# Patient Record
Sex: Female | Born: 1955
Health system: Southern US, Community
[De-identification: ages and names within clinical notes are randomized; demographics above are authoritative.]

## PROBLEM LIST (undated history)

## (undated) DIAGNOSIS — I8390 Asymptomatic varicose veins of unspecified lower extremity: Secondary | ICD-10-CM

## (undated) DIAGNOSIS — R9389 Abnormal findings on diagnostic imaging of other specified body structures: Secondary | ICD-10-CM

## (undated) DIAGNOSIS — M2550 Pain in unspecified joint: Secondary | ICD-10-CM

## (undated) DIAGNOSIS — T7840XA Allergy, unspecified, initial encounter: Secondary | ICD-10-CM

## (undated) HISTORY — PX: VARICOSE VEIN SURGERY: SHX832

## (undated) HISTORY — PX: TONSILLECTOMY: SUR1361

## (undated) HISTORY — PX: TONSILECTOMY, ADENOIDECTOMY, BILATERAL MYRINGOTOMY AND TUBES: SHX2538

## (undated) HISTORY — DX: Pain in unspecified joint: M25.50

## (undated) HISTORY — PX: DILATATION & CURRETTAGE/HYSTEROSCOPY WITH RESECTOCOPE: SHX5572

## (undated) HISTORY — DX: Allergy, unspecified, initial encounter: T78.40XA

---

## 1987-01-29 HISTORY — PX: OTHER SURGICAL HISTORY: SHX169

## 1992-01-29 HISTORY — PX: TUBAL LIGATION: SHX77

## 1997-06-23 ENCOUNTER — Other Ambulatory Visit: Admission: RE | Admit: 1997-06-23 | Discharge: 1997-06-23 | Payer: Self-pay | Admitting: Gynecology

## 1997-06-27 ENCOUNTER — Ambulatory Visit (HOSPITAL_COMMUNITY): Admission: RE | Admit: 1997-06-27 | Discharge: 1997-06-27 | Payer: Self-pay | Admitting: Gynecology

## 1998-10-24 ENCOUNTER — Ambulatory Visit (HOSPITAL_COMMUNITY): Admission: RE | Admit: 1998-10-24 | Discharge: 1998-10-24 | Payer: Self-pay | Admitting: Gynecology

## 1998-10-24 ENCOUNTER — Encounter: Payer: Self-pay | Admitting: Gynecology

## 1999-05-17 ENCOUNTER — Other Ambulatory Visit: Admission: RE | Admit: 1999-05-17 | Discharge: 1999-05-17 | Payer: Self-pay | Admitting: Gynecology

## 2000-11-24 ENCOUNTER — Other Ambulatory Visit: Admission: RE | Admit: 2000-11-24 | Discharge: 2000-11-24 | Payer: Self-pay | Admitting: Gynecology

## 2000-12-02 ENCOUNTER — Encounter: Payer: Self-pay | Admitting: Gynecology

## 2000-12-02 ENCOUNTER — Ambulatory Visit (HOSPITAL_COMMUNITY): Admission: RE | Admit: 2000-12-02 | Discharge: 2000-12-02 | Payer: Self-pay | Admitting: Gynecology

## 2001-01-26 ENCOUNTER — Other Ambulatory Visit: Admission: RE | Admit: 2001-01-26 | Discharge: 2001-01-26 | Payer: Self-pay | Admitting: Gynecology

## 2001-09-14 ENCOUNTER — Other Ambulatory Visit: Admission: RE | Admit: 2001-09-14 | Discharge: 2001-09-14 | Payer: Self-pay | Admitting: Gynecology

## 2002-09-23 ENCOUNTER — Other Ambulatory Visit: Admission: RE | Admit: 2002-09-23 | Discharge: 2002-09-23 | Payer: Self-pay | Admitting: Gynecology

## 2005-03-27 ENCOUNTER — Encounter: Payer: Self-pay | Admitting: Gynecology

## 2005-04-10 ENCOUNTER — Other Ambulatory Visit: Admission: RE | Admit: 2005-04-10 | Discharge: 2005-04-10 | Payer: Self-pay | Admitting: Gynecology

## 2006-04-11 ENCOUNTER — Ambulatory Visit (HOSPITAL_COMMUNITY): Admission: RE | Admit: 2006-04-11 | Discharge: 2006-04-11 | Payer: Self-pay | Admitting: Gynecology

## 2008-10-31 ENCOUNTER — Ambulatory Visit (HOSPITAL_COMMUNITY): Admission: RE | Admit: 2008-10-31 | Discharge: 2008-10-31 | Payer: Self-pay | Admitting: Gynecology

## 2008-11-24 ENCOUNTER — Ambulatory Visit (HOSPITAL_BASED_OUTPATIENT_CLINIC_OR_DEPARTMENT_OTHER): Admission: RE | Admit: 2008-11-24 | Discharge: 2008-11-24 | Payer: Self-pay | Admitting: Gynecology

## 2008-11-24 ENCOUNTER — Encounter (INDEPENDENT_AMBULATORY_CARE_PROVIDER_SITE_OTHER): Payer: Self-pay | Admitting: Gynecology

## 2010-05-03 ENCOUNTER — Encounter (INDEPENDENT_AMBULATORY_CARE_PROVIDER_SITE_OTHER): Payer: Commercial Managed Care - PPO

## 2010-05-03 DIAGNOSIS — I83893 Varicose veins of bilateral lower extremities with other complications: Secondary | ICD-10-CM

## 2010-05-03 LAB — POCT HEMOGLOBIN-HEMACUE: Hemoglobin: 15.2 g/dL — ABNORMAL HIGH (ref 12.0–15.0)

## 2010-05-07 ENCOUNTER — Encounter (INDEPENDENT_AMBULATORY_CARE_PROVIDER_SITE_OTHER): Payer: Commercial Managed Care - PPO | Admitting: Vascular Surgery

## 2010-05-07 DIAGNOSIS — I83893 Varicose veins of bilateral lower extremities with other complications: Secondary | ICD-10-CM

## 2010-05-08 NOTE — Procedures (Unsigned)
LOWER EXTREMITY VENOUS REFLUX EXAM  INDICATION:  Varicose veins.  EXAM:  Using color-flow imaging and pulse Doppler spectral analysis, the right and left common femoral, superficial femoral, popliteal, posterior tibial, greater and lesser saphenous veins are evaluated.  There is evidence suggesting deep venous insufficiency in the right and left common femoral veins.  The right and left saphenofemoral junctions are not competent with Reflux of >58milliseconds. The right and left GSV is not competent with Reflux of >542milliseconds with the caliber as described below.  The right and left proximal short saphenous vein demonstrates competency.  GSV Diameter (used if found to be incompetent only)                                                Right     Left Proximal Greater Saphenous Vein                0.53 cm   0.63 cm Proximal-to-mid-thigh                          0.41 cm   0.63 cm Mid thigh                                      0.44 cm   0.62 cm Mid-distal thigh                               cm        cm Distal thigh                                   0.41 cm   0.42 cm Knee                                           cm        cm  IMPRESSION: 1. Right and left greater saphenous veins are not competent with     reflux >500 milliseconds. 2. The right and left greater saphenous veins are not tortuous. 3. The deep venous system is competent. 4. The right and left small saphenous veins are competent.  ___________________________________________ Quita Skye. Hart [place], M.D.  OD/MEDQ  D:  05/03/2010  T:  05/03/2010  Job:  [id number]

## 2010-05-08 NOTE — Consult Note (Signed)
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  She describes an aching, throbbing discomfort in both legs as the day progresses and she has tried treating this with elastic compression stockings without success.  She has noted darkening of the skin proximal to her right ankle over the last few years but no spontaneous stasis ulcers have occurred.  The area was traumatized by her dog in January of this year and it took several weeks for the skin to heal in this area with the darkness is located.  She has no history of DVT, thrombophlebitis, bleeding varicosities but does have edema in the ankles as the day progresses.  CHRONIC MEDICAL PROBLEMS:  Denies diabetes, hypertension, hyperlipidemia, coronary artery disease, COPD or stroke.  Has a history of uterine fibroid which was treated with ablation therapy.  SOCIAL HISTORY:  She is married, has 4 children, works as an Charity fundraiser.  Does not use tobacco or alcohol.  FAMILY HISTORY:  Positive for cerebral hemorrhage in her mother. Negative for coronary artery disease.  Positive for diabetes in an aunt.  REVIEW OF SYSTEMS:  Positive for headaches.  Denies chest pain, dyspnea on exertion, PND, orthopnea.  No claudication.  Denies any musculoskeletal or GI problems.  All other systems in complete review of systems are negative.  PHYSICAL EXAMINATION:  Vital signs:  Blood pressure 112/70, heart rate 70, respirations 18.  General:  She is a well-developed, well-nourished female in no apparent distress, alert and oriented x3.  HEENT:  Exam is normal for age.  EOMs intact.  Lungs:  Clear to auscultation.  No rhonchi or wheezing.  Cardiovascular:  Regular rhythm.  No  murmurs. Carotid pulses 3+.  No audible bruits.  Abdomen:  Soft, nontender with no masses.  Musculoskeletal:  Free of major deformities.  Neurological: Normal.  Skin:  Exam reveals dermatoliposclerosis in the right ankle lower third of the leg proximal to the medial malleolus with evidence of a healed ulcer in this area.  There are some bulging varicosities in the left leg in the distal thigh and medial calf area.  There is 1+ edema bilaterally.  She has 3+ femoral, popliteal and dorsalis pedis pulses palpable bilaterally.  She had a venous duplex exam performed in our office on April 5 which I reviewed and interpreted.  She has gross reflux in both great saphenous systems from the distal thigh to the saphenofemoral junction but no evidence of DVT.  This patient does have severe venous insufficiency which has caused significant skin changes in the right leg and has symptomatic bulging varicosities in the left leg.  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  We will treat her with long leg elastic compression stockings (20 mm - 30 mm gradient) as well as elevation and ibuprofen.  She will return in 3 months.  If there has been no improvement she should have a) laser ablation of the right great saphenous vein followed by b) laser ablation  of the left great saphenous vein.  She will return in 3 months for further followup.    Quita Skye Hart Rochester, M.D. Electronically Signed  JDL/MEDQ  D:  05/07/2010  T:  05/08/2010  Job:  [id number]

## 2010-08-13 ENCOUNTER — Ambulatory Visit (INDEPENDENT_AMBULATORY_CARE_PROVIDER_SITE_OTHER): Payer: Commercial Managed Care - PPO | Admitting: Vascular Surgery

## 2010-08-13 DIAGNOSIS — I83893 Varicose veins of bilateral lower extremities with other complications: Secondary | ICD-10-CM

## 2010-08-14 NOTE — Assessment & Plan Note (Signed)
OFFICE VISIT  CASIDEE, JANN DOB:  06/25/1955                                       08/13/2010 ZOXWR#:[id number]  Ms. Heminger returns for a follow-up visit regarding her severe venous insufficiency of both lower extremities.  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  She has also tried ibuprofen.  She continues to have aching, throbbing and burning discomfort which worsens as the day progresses.  She has had darkening of the skin proximal to her right ankle which has progressed over the last few years with some thickening but no stasis ulcers.  The bulging varicosities in her left thigh and calf have become increasingly symptomatic and are affecting her daily living and ability to work.  On examination, she continues to have bulging varicosities in the left thigh over the great saphenous system.  She has hyperpigmentation and lipodermatosclerosis in the right lower leg in the lower third with some very prominent reticular veins beneath the thickened skin and distally. She has 3+ dorsalis pedis pulses bilaterally.  She does have documented gross reflux in both great saphenous systems from the knee to the saphenofemoral junction.  I think the best plan would be to proceed with: 1. Laser ablation of the right great saphenous vein to be followed by     one course of sclerotherapy 2. Laser ablation of the left great saphenous vein with 10-20 stab     phlebectomies at the same setting.  We will proceed with precertification for Ms. Eddie in the near future.    Quita Skye Hart [place], M.D. Electronically Signed  JDL/MEDQ  D:  08/13/2010  T:  08/14/2010  Job:  [id number]

## 2010-09-06 ENCOUNTER — Encounter: Payer: Self-pay | Admitting: Vascular Surgery

## 2010-09-17 ENCOUNTER — Other Ambulatory Visit: Payer: Commercial Managed Care - PPO | Admitting: Vascular Surgery

## 2010-09-17 ENCOUNTER — Ambulatory Visit (INDEPENDENT_AMBULATORY_CARE_PROVIDER_SITE_OTHER): Payer: Commercial Managed Care - PPO | Admitting: Vascular Surgery

## 2010-09-17 VITALS — BP 125/73 | HR 70 | Resp 20 | Ht 65.0 in | Wt 182.0 lb

## 2010-09-17 DIAGNOSIS — I83893 Varicose veins of bilateral lower extremities with other complications: Secondary | ICD-10-CM

## 2010-09-17 NOTE — Progress Notes (Signed)
Laser ablation R GSV 09/17/10

## 2010-09-17 NOTE — Progress Notes (Signed)
Subjective:     Patient ID: Felicia Sullivan, female   DOB: January 21, 1956, 55 y.o.   MRN: [id number]  HPI this patient underwent laser ablation of the right great saphenous vein under local tumescent anesthesia tolerated the procedure well. She will return in one week for a venous duplex exam to confirm closure of the right great saphenous vein.   Review of Systems     Objective:   Physical Exam Blood pressures 125 or 73 heart rate 70 respirations 20    Assessment:     Laser ablation of right great saphenous vein-tolerated well    Plan:     Return on August 27 for venous duplex exam of right leg to confirm closure and then schedule similar procedure on the contralateral left leg.

## 2010-09-18 ENCOUNTER — Telehealth: Payer: Self-pay | Admitting: *Deleted

## 2010-09-18 NOTE — Telephone Encounter (Signed)
Left a message checking on pt's status after her laser ablation. Asked her to call me if she was having any problems or had any questions.

## 2010-09-24 ENCOUNTER — Ambulatory Visit (INDEPENDENT_AMBULATORY_CARE_PROVIDER_SITE_OTHER): Payer: Commercial Managed Care - PPO | Admitting: Vascular Surgery

## 2010-09-24 ENCOUNTER — Ambulatory Visit (INDEPENDENT_AMBULATORY_CARE_PROVIDER_SITE_OTHER): Payer: Commercial Managed Care - PPO

## 2010-09-24 VITALS — BP 119/70 | HR 67 | Resp 20 | Ht 65.0 in | Wt 182.0 lb

## 2010-09-24 DIAGNOSIS — Z48812 Encounter for surgical aftercare following surgery on the circulatory system: Secondary | ICD-10-CM

## 2010-09-24 DIAGNOSIS — I83893 Varicose veins of bilateral lower extremities with other complications: Secondary | ICD-10-CM

## 2010-09-24 NOTE — Progress Notes (Signed)
One week fu laser ablation R GSV.

## 2010-09-24 NOTE — Progress Notes (Signed)
Subjective:     Patient ID: Felicia Sullivan, female   DOB: 08/24/1955, 55 y.o.   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 She has had minimal discomfort in the right thigh since the procedure. She has been wearing her right long leg elastic compression stocking and taking ibuprofen as instructed.  Past Medical History  Diagnosis Date  . Headache   . Phlebitis     History  Substance Use Topics  . Smoking status: Never Smoker   . Smokeless tobacco: Not on file  . Alcohol Use: No    Family History  Problem Relation Age of Onset  . Stroke Mother     Allergies  Allergen Reactions  . Aspirin   . Penicillins   . Tetracyclines & Related     No current outpatient prescriptions on file.  BP 119/70  Pulse 67  Resp 20  Ht 5\' 5"  (1.651 m)  Wt 182 lb (82.555 kg)  BMI 30.29 kg/m2  Body mass index is 30.29 kg/(m^2).       Review of Systems she denies any chest pain dyspnea on exertion PND or orthopnea. He has had no hemoptysis.    Objective:   Physical Exam blood pressure 119/70 heart rate 67 respirations 20 Right leg exam reveals 3+ femoral and dorsalis pedis pulse palpable There is minimal discomfort along the course of the right great saphenous vein from the saphenofemoral junction to the knee The area of hyperpigmentation in the medial aspect of the right ankle is unchanged with no active ulceration    Assessment:    today I ordered a venous duplex exam of the right leg which are reviewed and interpreted. There is complete closure of the right great saphenous vein from 1 cm from the junction to the distal thigh. There is no DVT.    Plan:     Return in one week for laser ablation left great saphenous vein with multiple stab phlebectomy

## 2010-10-05 ENCOUNTER — Encounter: Payer: Self-pay | Admitting: Vascular Surgery

## 2010-10-08 ENCOUNTER — Ambulatory Visit (INDEPENDENT_AMBULATORY_CARE_PROVIDER_SITE_OTHER): Payer: Commercial Managed Care - PPO | Admitting: Vascular Surgery

## 2010-10-08 VITALS — BP 115/88 | HR 64 | Resp 20 | Ht 65.0 in | Wt 181.0 lb

## 2010-10-08 DIAGNOSIS — I83893 Varicose veins of bilateral lower extremities with other complications: Secondary | ICD-10-CM

## 2010-10-08 NOTE — Progress Notes (Signed)
Subjective:     Patient ID: Felicia Sullivan, female   DOB: [birth year], 55 y.o.   MRN: [id number]  HPI this 55 year old female had laser laser of her left great saphenous vein from the mid thigh to the saphenofemoral junction done under local tumescent anesthesia. She also had 10-20 stab phlebectomy was performed. She tolerated both procedures well. Long-leg elastic compression stocking was applied.   Review of Systems     Objective:   Physical Exam blood pressure 150/88 heart rate 64 respirations 20    Assessment:    well-tolerated laser ablation left great saphenous vein +10-20 stab phlebectomy    Plan:     Return in one week for venous duplex exam to confirm closure left great saphenous vein

## 2010-10-08 NOTE — Progress Notes (Signed)
Addended by: Clementeen Hoof on: 10/08/2010 12:32 PM   Modules accepted: Orders

## 2010-10-08 NOTE — Progress Notes (Signed)
Subjective:     Patient ID: Felicia Sullivan, female   DOB: 1955-10-07, 55 y.o.   MRN: [id number]  HPI  Laser Ablation Procedure      Date: 10/08/2010    Felicia Sullivan DOB:Jun 23, 1955  Consent signed: yes  Surgeon:James D. Hart Rochester  Procedure: Laser Ablation: Left great  Saphenous Vein  BP 115/88  Pulse 64  Resp 20  Ht 5\' 5"  (1.651 m)  Wt 181 lb (82.101 kg)  BMI 30.12 kg/m2  Start time: 11:00   End time: 12:10  Tumescent Anesthesia: 300 cc 0.9% NaCl with 50 cc Lidocaine HCL with 1% Epi and 15 cc 8.4% NaHCO3  Local Anesthesia: 11 cc Lidocaine HCL and NaHCO3 (ratio 2:1)  Pulsed mode: 15 watts, 1 sec, 1 pulse   Stab Phlebectomy: 10-20 Sites:   Patient tolerated procedure well: yes   Description of Procedure:  After marking the course of the saphenous vein and the secondary varicosities in the standing position, the patient was placed on the operating table in the supine position, and left leg was prepped and draped in sterile fashion. Local anesthetic was administered, and under ultrasound guidance the saphenous vein was accessed with a micro needle and guide wire; then the micro puncture sheath was placed. A guide wire was inserted to the saphenofemoral junction, followed by a 5 french sheath.  The position of the sheath and then the laser fiber below the junction was confirmed using the ultrasound and visualization of the aiming beam.  Tumescent anesthesia was administered along the course of the saphenous vein using ultrasound guidance. Protective laser glasses were placed on the patient, and the laser was fired at 15 watts.  For a total of 779 joules.  A steri strip was applied to the puncture site.  The patient was then put into Trendelenburg position.  Local anesthetic was utilized overlying the marked varicosities. Ten-20 stab wounds were made using the tip of an 11 blade; and using the vein hook.  The phlebectomies were performed using a hemostat to avulse these varicosities.   Adequate hemostasis was achieved, and steri strips were applied to the stab wound.     ABD pads and thigh high compression stockings were applied.  Ace wrap bandages were applied over the phlebectomy sites and to the top of the saphenofemoral junction.  Blood loss was less than 15 cc.  The patient ambulated out of the operating room having tolerated the procedure well.    Review of Systems     Physical Exam

## 2010-10-09 ENCOUNTER — Telehealth: Payer: Self-pay | Admitting: *Deleted

## 2010-10-09 NOTE — Procedures (Unsigned)
DUPLEX DEEP VENOUS EXAM - LOWER EXTREMITY  INDICATION:  One week followup right GSV ablation.  HISTORY:  Edema:  No. Trauma/Surgery:  EVLT. Pain:  No. PE:  No. Previous DVT:  No. Anticoagulants:  No. Other:  DUPLEX EXAM:               CFV   SFV   PopV  PTV    GSV               R  L  R  L  R  L  R   L  R  L Thrombosis    o     o     o     o      + Spontaneous      +  +     +            o Phasic           +  +     +            o Augmentation     +  +     +     +      o Compressible     +  +     +     +      o Competent        +  +     +     +      o  Legend:  + - yes  o - no  p - partial  D - decreased  IMPRESSION: 1. Right greater saphenous vein appears ablated from 0.92 distal to     the junction through the proximal calf at the insertion point. 2. No deep venous involvement was observed.   _____________________________ Quita Skye. Hart Rochester, M.D.  LT/MEDQ  D:  09/24/2010  T:  09/24/2010  Job:  [id number]

## 2010-10-09 NOTE — Telephone Encounter (Signed)
Called patient the morning after her laser ablation of her L GSV. She is doing well, having no problems, following all instructions. We scheduled her approved sclero treatment and she will return next week for her lab study and JDL visit.

## 2010-10-12 ENCOUNTER — Encounter: Payer: Self-pay | Admitting: Vascular Surgery

## 2010-10-15 ENCOUNTER — Ambulatory Visit (INDEPENDENT_AMBULATORY_CARE_PROVIDER_SITE_OTHER): Payer: Commercial Managed Care - PPO | Admitting: Vascular Surgery

## 2010-10-15 ENCOUNTER — Other Ambulatory Visit (INDEPENDENT_AMBULATORY_CARE_PROVIDER_SITE_OTHER): Payer: Commercial Managed Care - PPO

## 2010-10-15 VITALS — BP 133/63 | HR 80 | Resp 18 | Ht 65.0 in | Wt 181.0 lb

## 2010-10-15 DIAGNOSIS — Z48812 Encounter for surgical aftercare following surgery on the circulatory system: Secondary | ICD-10-CM

## 2010-10-15 DIAGNOSIS — I83893 Varicose veins of bilateral lower extremities with other complications: Secondary | ICD-10-CM

## 2010-10-15 DIAGNOSIS — I8310 Varicose veins of unspecified lower extremity with inflammation: Secondary | ICD-10-CM

## 2010-10-15 NOTE — Progress Notes (Signed)
Subjective:     Patient ID: Edmonia Caprio, female   DOB: [birth year], 55 y.o.   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 She tolerated the procedure well last week and has had minimal discomfort. She has had some mild aching in the thigh and proximal calf area. She has had no distal edema. She did wear the elastic compression stocking as recommended as well as take ibuprofen and elevate the leg.  Review of Systems     Objective:   Physical Exam blood pressure 133/63 heart rate 80 respirations 18 She is alert and oriented x3 and in no apparent distress Lower extremity exam reveals the left leg to have some mild to moderate ecchymoses around the stab phlebectomy sites in the thigh and calf region. There is no evidence of infection. There is no distal edema. She has 3+ dorsalis pedis pulse palpable the left foot. There is mild discomfort along the course of the ablated saphenous vein in the thigh.  Today I ordered a venous duplex exam which I reviewed and interpreted. There is no evidence of DVT. The left great saphenous vein is totally closed from the distal thigh to the saphenofemoral junction.    Assessment:     Successful ablation left great saphenous vein with multiple stab phlebectomy    Plan:     Will be scheduled for sclerotherapy of residual varicosities to conclude her scheduled treatment sessions

## 2010-10-16 NOTE — Progress Notes (Signed)
Addended by: Sharee Pimple on: 10/16/2010 11:12 AM   Modules accepted: Orders

## 2010-10-24 NOTE — Procedures (Unsigned)
DUPLEX DEEP VENOUS EXAM - LOWER EXTREMITY  INDICATION:  Followup great saphenous vein ablation.  HISTORY:  Edema:  No. Trauma/Surgery:  Left GSV ablation 10/08/2010. Pain:  Tenderness. PE:  No. Previous DVT:  No. Anticoagulants:  No. Other:  DUPLEX EXAM:               CFV   SFV   PopV  PTV    GSV               R  L  R  L  R  L  R   L  R  L Thrombosis    o  o     o     o      o     + Spontaneous   +  +     +     +      +     o Phasic        +  +     +     +      +     o Augmentation  +  +     +     +      +     o Compressible  +  +     +     +      +     o Competent     +  o     +     +      +     +  Legend:  + - yes  o - no  p - partial  D - decreased  IMPRESSION: 1. No evidence of left lower extremity deep venous thrombosis was     noted. 2. The left great saphenous vein is thrombosed from the distal     insertion site to the saphenofemoral junction.   _____________________________ Felicia Sullivan, M.D.  EM/MEDQ  D:  10/16/2010  T:  10/16/2010  Job:  [id number]

## 2010-11-07 ENCOUNTER — Ambulatory Visit (INDEPENDENT_AMBULATORY_CARE_PROVIDER_SITE_OTHER): Payer: Commercial Managed Care - PPO | Admitting: *Deleted

## 2010-11-07 ENCOUNTER — Ambulatory Visit: Payer: Commercial Managed Care - PPO

## 2010-11-07 DIAGNOSIS — I83893 Varicose veins of bilateral lower extremities with other complications: Secondary | ICD-10-CM

## 2010-11-07 NOTE — Progress Notes (Signed)
Subjective:     Patient ID: Felicia Sullivan, female   DOB: Dec 04, 1955, 55 y.o.   MRN: [id number]  HPI  X=.3% Sotradecol administered with a 27g butterfly.  Patient received a total of 11cc foam.        Photos: yes archived  Compression stockings applied: yes and ace wraps   Review of Systems     Objective:   Physical Exam     Assessment:     Treated all areas of concern. Small spiders that surrounded area of skin discoloration at inner ankles and large reticulars on upper Right thigh. Anticipate good results.    Plan:    Follow prn.

## 2011-01-04 ENCOUNTER — Encounter: Payer: Self-pay | Admitting: Vascular Surgery

## 2011-01-07 ENCOUNTER — Encounter: Payer: Self-pay | Admitting: Vascular Surgery

## 2012-03-14 ENCOUNTER — Other Ambulatory Visit: Payer: Self-pay

## 2012-03-25 ENCOUNTER — Other Ambulatory Visit: Payer: Self-pay | Admitting: Gynecology

## 2012-03-25 ENCOUNTER — Other Ambulatory Visit: Payer: Self-pay | Admitting: Obstetrics and Gynecology

## 2012-03-25 DIAGNOSIS — M8580 Other specified disorders of bone density and structure, unspecified site: Secondary | ICD-10-CM

## 2012-03-25 DIAGNOSIS — Z1231 Encounter for screening mammogram for malignant neoplasm of breast: Secondary | ICD-10-CM

## 2012-04-22 ENCOUNTER — Ambulatory Visit
Admission: RE | Admit: 2012-04-22 | Discharge: 2012-04-22 | Disposition: A | Discharge status: Home / Self Care | Payer: 59 | Source: Ambulatory Visit | Attending: Obstetrics and Gynecology | Admitting: Obstetrics and Gynecology

## 2012-04-22 DIAGNOSIS — M8580 Other specified disorders of bone density and structure, unspecified site: Secondary | ICD-10-CM

## 2012-04-22 DIAGNOSIS — Z1231 Encounter for screening mammogram for malignant neoplasm of breast: Secondary | ICD-10-CM

## 2012-04-23 IMAGING — MG MM DIGITAL SCREENING BILAT
8 series · 8 of 24 positions shown · non-contrast
Comparison: Previous exams.

CLINICAL DATA: Screening.

DIGITAL BILATERAL SCREENING MAMMOGRAM WITH CAD
DIGITAL BREAST TOMOSYNTHESIS
Digital breast tomosynthesis images are acquired in two
projections.  These images are reviewed in combination with the
digital mammogram, confirming the findings below.

[L MLO]
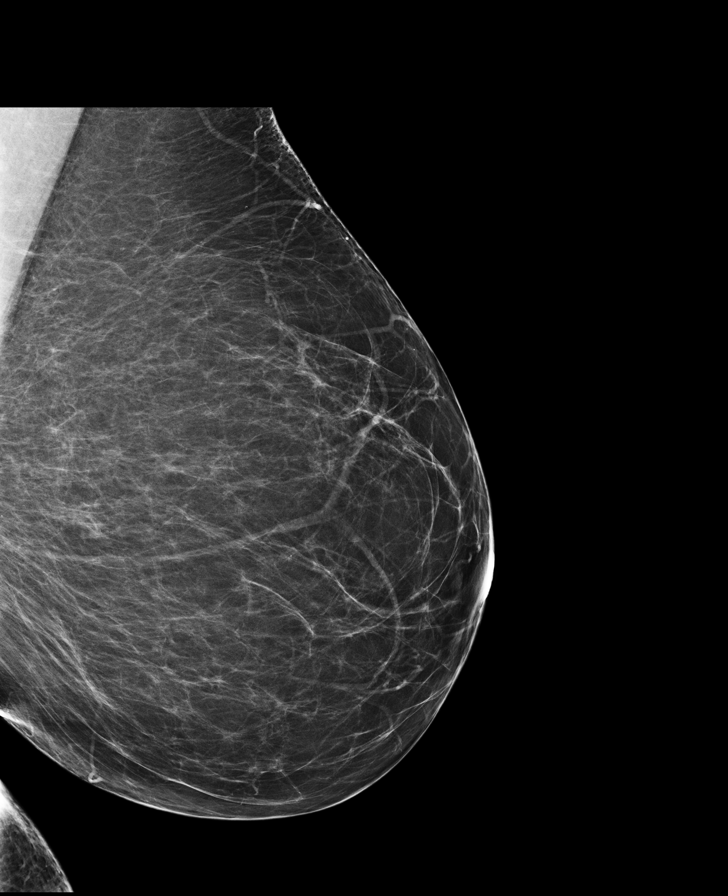

[R CC]
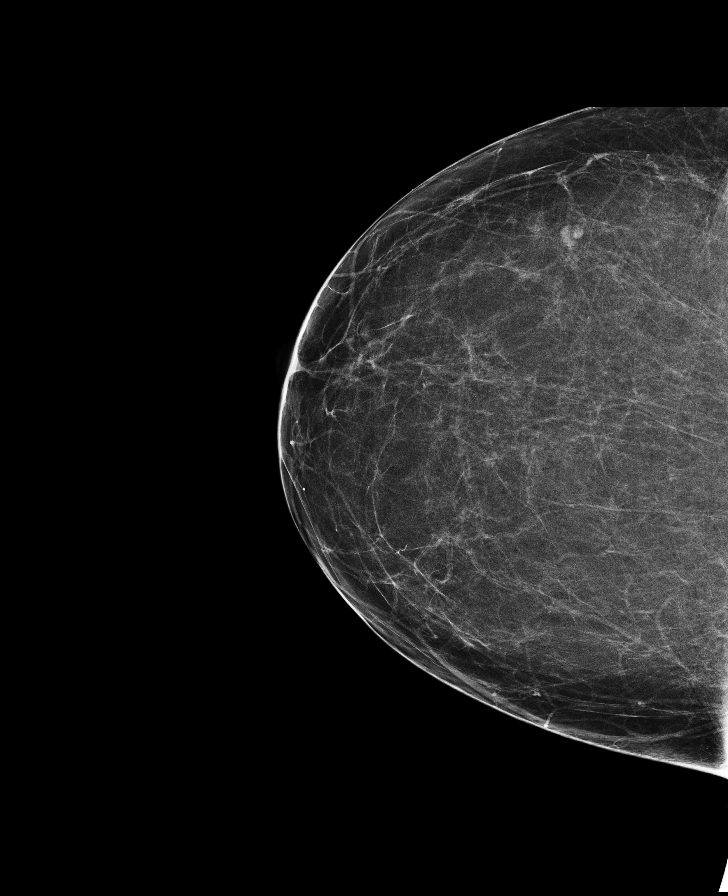

[L CC]
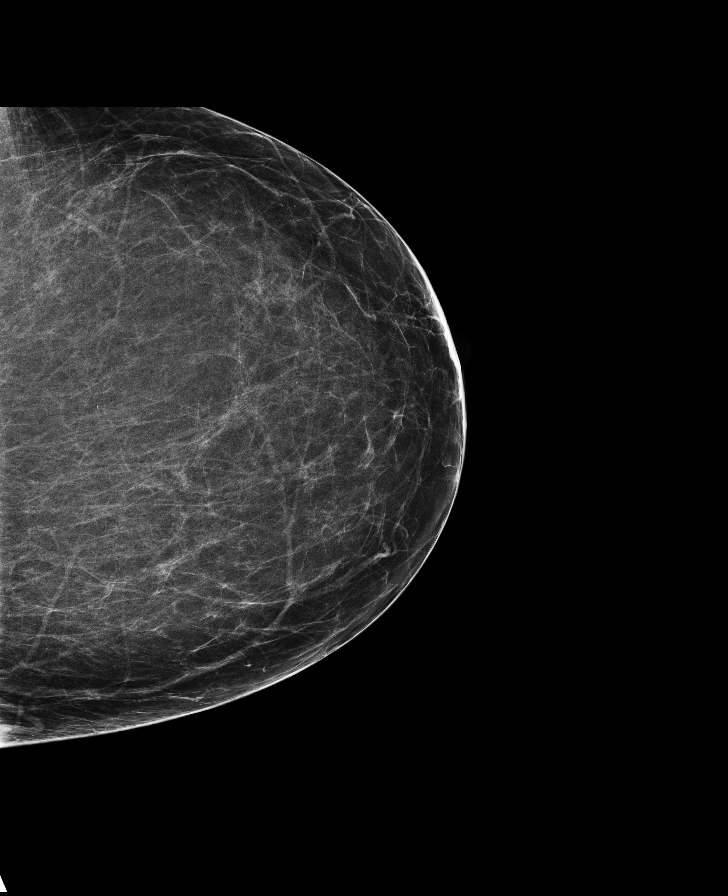

[R MLO]
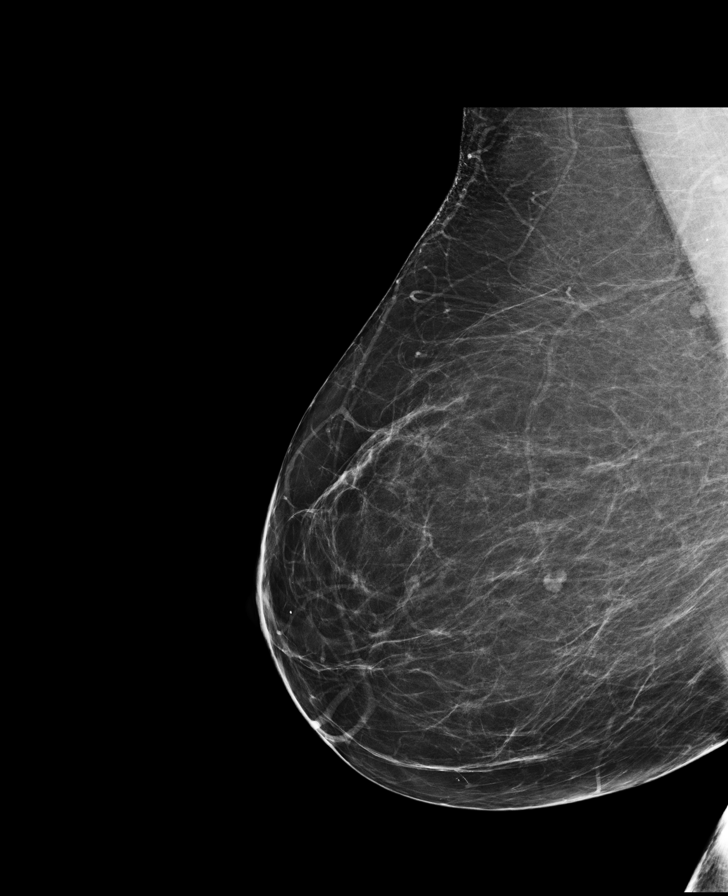

[R CC tomo · tomo slice 41/80.0]
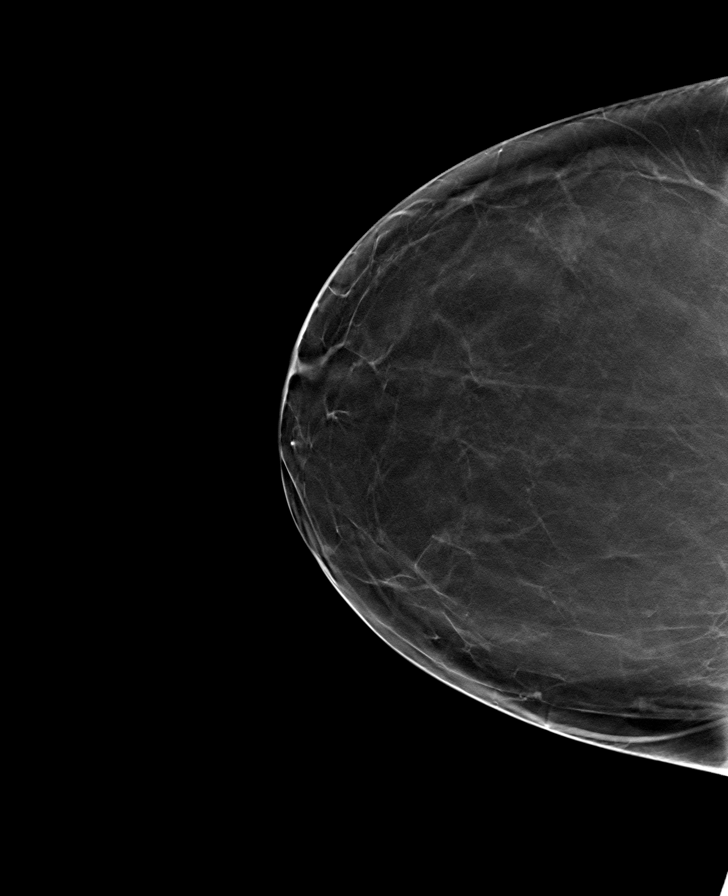

[L CC tomo · tomo slice 45/89.0]
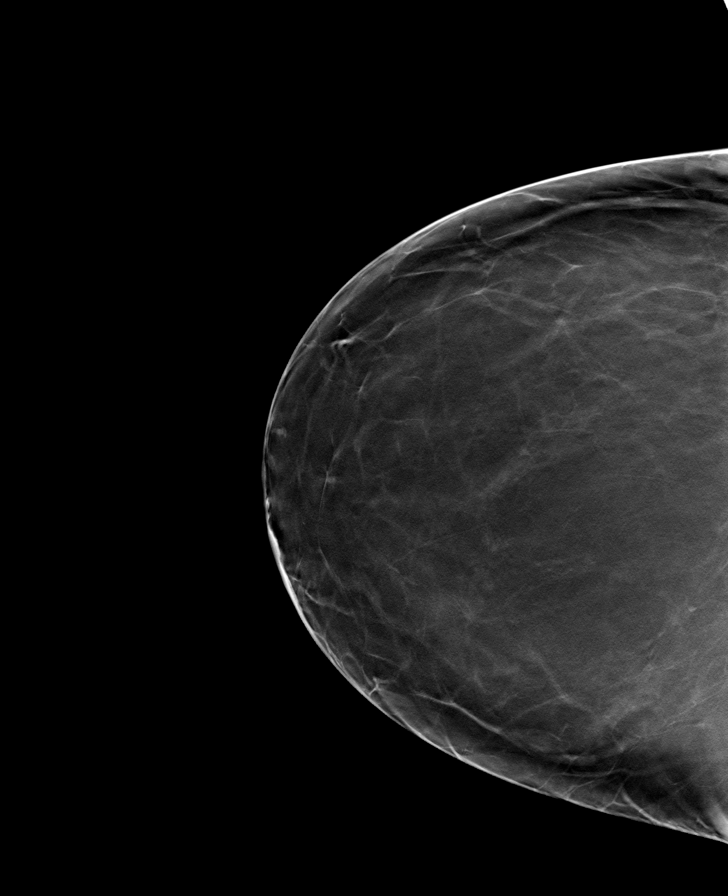

[R MLO tomo · tomo slice 46/91.0]
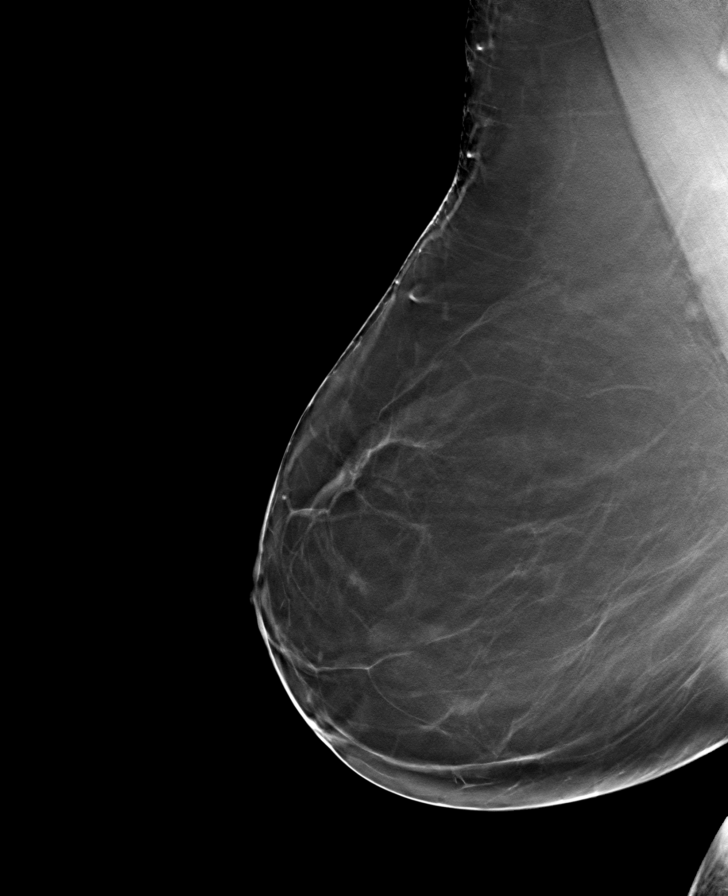

[L MLO tomo · tomo slice 45/90.0]
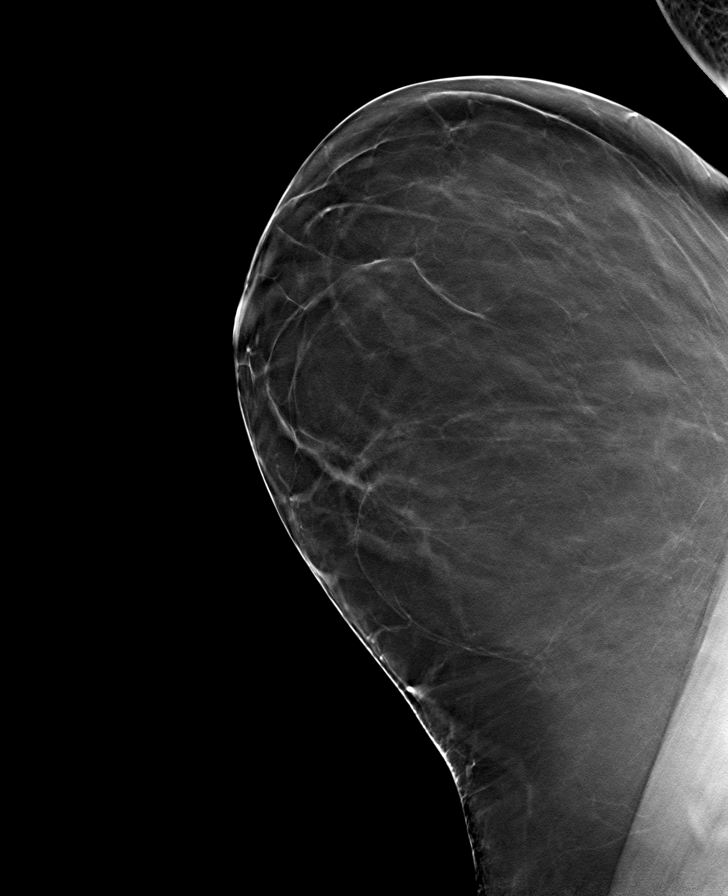

[8 of 24 positions shown; findings below may reference images not displayed]

FINDINGS: ACR Breast Density Category 2: There is a scattered fibroglandular
pattern.

No suspicious masses, architectural distortion, or calcifications
are present.

Images were processed with CAD.
IMPRESSION: No mammographic evidence of malignancy.

A result letter of this screening mammogram will be mailed directly
to the patient.

RECOMMENDATION:
Screening mammogram in one year. (Code:[OK])

BI-RADS CATEGORY 1:  Negative.

## 2012-05-27 ENCOUNTER — Encounter (HOSPITAL_BASED_OUTPATIENT_CLINIC_OR_DEPARTMENT_OTHER): Payer: Self-pay | Admitting: *Deleted

## 2012-05-27 NOTE — H&P (Addendum)
Felicia Sullivan is an 57 y.o. female with endometrial cells on pap, but post-menopausal.  Also thickened uterine lining on [place].  D/W pt r/b/a of hysteroscopy and D&C, wish to proceed,    Pertinent Gynecological History: Menses: post-menopausal Bleeding: none, but thickened EMS Contraception: post-menopause DES exposure: unknown Blood transfusions: none Sexually transmitted diseases: no past history Previous GYN Procedures: DNC and hysteroscopy, endo ablation by resection, BTL  Last mammogram: normal Last pap: normal, but endometrial cells 03/2012 OB History: G5, P4014, TSVD x 4   Menstrual History: Menarche age: 57yo  No LMP recorded. Patient is postmenopausal. LMP 2010    Past Medical History  Diagnosis Date  . Varicosities of leg   . Endometrial thickening on ultra sound   osteopenia  Past Surgical History  Procedure Laterality Date  . Tonsilectomy, adenoidectomy, bilateral myringotomy and tubes    . Dilatation & currettage/hysteroscopy with resectocope  11-24-2008  DR LOMAX    RESECTION FIBROID AND ENDOMETRIAL ABLATION  . Varicose vein surgery      BILATERAL GREAT SAPHENOUS  . Dilatation and evacuation  1989  . Tonsillectomy  AGE 38  . Tubal ligation  1994    Family History  Problem Relation Age of Onset  . Stroke Mother   CAD mgm, maunt, m uncle; mo, fa HTN; aunt DM  Social History:  reports that she has never smoked. She has never used smokeless tobacco. She reports that  drinks alcohol. She reports that she does not use illicit drugs.married, occ walk  Allergies:  Allergies  Allergen Reactions  . Aspirin Rash  . Penicillins Rash  . Tetracyclines & Related Rash    No prescriptions prior to admission    Review of Systems  Constitutional: Negative.   HENT: Negative.   Eyes: Negative.   Respiratory: Negative.   Cardiovascular: Negative.   Gastrointestinal: Negative.   Genitourinary: Negative.        Occ [facility]  Musculoskeletal: Negative.   Skin: Negative.    Neurological: Negative.   Psychiatric/Behavioral: Negative.     Height 5\' 5"  (1.651 m), weight 94.802 kg (209 lb). Physical Exam  Constitutional: She is oriented to person, place, and time. She appears well-developed and well-nourished.  HENT:  Head: Normocephalic and atraumatic.  Cardiovascular: Normal rate and regular rhythm.   Respiratory: Effort normal and breath sounds normal. No respiratory distress.  GI: Soft. Bowel sounds are normal. There is no tenderness.  Genitourinary: Vagina normal and uterus normal.  Musculoskeletal: Normal range of motion.  Neurological: She is alert and oriented to person, place, and time.  Skin: Skin is warm and dry.  Psychiatric: She has a normal mood and affect. Her behavior is normal.    No results found for this or any previous visit (from the past 24 hour(s)).  No results found.  Assessment/Plan: 16XW [id number] with thickened EMS and endo cells on pap.  D/w r/b/a wish to proceed  BOVARD,Lodie Waheed 05/27/2012, 8:35 PM  Reviewed H&P, no changes.  Reviewed procedure including r/b/a wish to proceed.

## 2012-05-27 NOTE — Progress Notes (Signed)
NPO AFTER MN. ARRIVES AT 0600. PER DR BOVARD VIA TELEPHONE ,"WILL ORDER CBC", ORDERS PENDING IN EPIC.

## 2012-05-28 ENCOUNTER — Ambulatory Visit: Admit: 2012-05-28 | Payer: Self-pay | Admitting: Obstetrics and Gynecology

## 2012-05-28 SURGERY — DILATATION AND CURETTAGE /HYSTEROSCOPY
Anesthesia: Choice

## 2012-05-28 NOTE — Anesthesia Preprocedure Evaluation (Addendum)
Anesthesia Evaluation  Patient identified by MRN, date of birth, ID band Patient awake    Reviewed: Allergy & Precautions, H&P , NPO status , Patient's Chart, lab work & pertinent test results  Airway Mallampati: II TM Distance: >3 FB Neck ROM: full    Dental  (+) Missing and Dental Advisory Given One lateral front upper incissor on each side is missing:   Pulmonary neg pulmonary ROS,  breath sounds clear to auscultation  Pulmonary exam normal       Cardiovascular Exercise Tolerance: Good negative cardio ROS  Rhythm:regular Rate:Normal     Neuro/Psych negative neurological ROS  negative psych ROS   GI/Hepatic negative GI ROS, Neg liver ROS,   Endo/Other  negative endocrine ROS  Renal/GU negative Renal ROS  negative genitourinary   Musculoskeletal   Abdominal   Peds  Hematology negative hematology ROS (+)   Anesthesia Other Findings   Reproductive/Obstetrics negative OB ROS                          Anesthesia Physical Anesthesia Plan  ASA: I  Anesthesia Plan: MAC   Post-op Pain Management:    Induction:   Airway Management Planned: Simple Face Mask  Additional Equipment:   Intra-op Plan:   Post-operative Plan:   Informed Consent: I have reviewed the patients History and Physical, chart, labs and discussed the procedure including the risks, benefits and alternatives for the proposed anesthesia with the patient or authorized representative who has indicated his/her understanding and acceptance.   Dental Advisory Given  Plan Discussed with: CRNA and Surgeon  Anesthesia Plan Comments:        Anesthesia Quick Evaluation

## 2012-05-29 ENCOUNTER — Encounter (HOSPITAL_BASED_OUTPATIENT_CLINIC_OR_DEPARTMENT_OTHER): Payer: Self-pay | Admitting: *Deleted

## 2012-05-29 ENCOUNTER — Ambulatory Visit (HOSPITAL_BASED_OUTPATIENT_CLINIC_OR_DEPARTMENT_OTHER)
Admission: RE | Admit: 2012-05-29 | Discharge: 2012-05-29 | Disposition: A | Discharge status: Home / Self Care | Payer: 59 | Source: Ambulatory Visit | Attending: Obstetrics and Gynecology | Admitting: Obstetrics and Gynecology

## 2012-05-29 ENCOUNTER — Ambulatory Visit (HOSPITAL_BASED_OUTPATIENT_CLINIC_OR_DEPARTMENT_OTHER): Payer: 59 | Admitting: Anesthesiology

## 2012-05-29 ENCOUNTER — Encounter (HOSPITAL_BASED_OUTPATIENT_CLINIC_OR_DEPARTMENT_OTHER)
Admission: RE | Disposition: A | Discharge status: Home / Self Care | Payer: Self-pay | Source: Ambulatory Visit | Attending: Obstetrics and Gynecology

## 2012-05-29 ENCOUNTER — Encounter (HOSPITAL_BASED_OUTPATIENT_CLINIC_OR_DEPARTMENT_OTHER): Payer: Self-pay | Admitting: Anesthesiology

## 2012-05-29 DIAGNOSIS — Z883 Allergy status to other anti-infective agents status: Secondary | ICD-10-CM | POA: Insufficient documentation

## 2012-05-29 DIAGNOSIS — I8390 Asymptomatic varicose veins of unspecified lower extremity: Secondary | ICD-10-CM | POA: Insufficient documentation

## 2012-05-29 DIAGNOSIS — R9389 Abnormal findings on diagnostic imaging of other specified body structures: Secondary | ICD-10-CM | POA: Insufficient documentation

## 2012-05-29 DIAGNOSIS — Z9889 Other specified postprocedural states: Secondary | ICD-10-CM

## 2012-05-29 DIAGNOSIS — M949 Disorder of cartilage, unspecified: Secondary | ICD-10-CM | POA: Insufficient documentation

## 2012-05-29 DIAGNOSIS — Z780 Asymptomatic menopausal state: Secondary | ICD-10-CM | POA: Insufficient documentation

## 2012-05-29 DIAGNOSIS — M899 Disorder of bone, unspecified: Secondary | ICD-10-CM | POA: Insufficient documentation

## 2012-05-29 DIAGNOSIS — Z886 Allergy status to analgesic agent status: Secondary | ICD-10-CM | POA: Insufficient documentation

## 2012-05-29 DIAGNOSIS — Z880 Allergy status to penicillin: Secondary | ICD-10-CM | POA: Insufficient documentation

## 2012-05-29 HISTORY — PX: HYSTEROSCOPY WITH D & C: SHX1775

## 2012-05-29 HISTORY — DX: Asymptomatic varicose veins of unspecified lower extremity: I83.90

## 2012-05-29 HISTORY — DX: Abnormal findings on diagnostic imaging of other specified body structures: R93.89

## 2012-05-29 LAB — CBC
HCT: 39.5 % (ref 36.0–46.0)
Hemoglobin: 14 g/dL (ref 12.0–15.0)
MCHC: 35.4 g/dL (ref 30.0–36.0)
RBC: 4.38 MIL/uL (ref 3.87–5.11)

## 2012-05-29 SURGERY — DILATATION AND CURETTAGE /HYSTEROSCOPY
Anesthesia: General | Site: Vagina | Wound class: Clean Contaminated

## 2012-05-29 MED ORDER — LIDOCAINE HCL (CARDIAC) 20 MG/ML IV SOLN
INTRAVENOUS | Status: DC | PRN
  Administered 2012-05-29: 50 mg via INTRAVENOUS

## 2012-05-29 MED ORDER — GLYCINE 1.5 % IR SOLN
Status: DC | PRN
  Administered 2012-05-29: 3000 mL

## 2012-05-29 MED ORDER — LACTATED RINGERS IV SOLN
INTRAVENOUS | Status: DC
  Filled 2012-05-29: qty 1000

## 2012-05-29 MED ORDER — LIDOCAINE-EPINEPHRINE 1 %-1:100000 IJ SOLN
INTRAMUSCULAR | Status: DC | PRN
  Administered 2012-05-29: 14 mL

## 2012-05-29 MED ORDER — LACTATED RINGERS IV SOLN
INTRAVENOUS | Status: DC
  Administered 2012-05-29 (×3): via INTRAVENOUS
  Filled 2012-05-29: qty 1000

## 2012-05-29 MED ORDER — FENTANYL CITRATE 0.05 MG/ML IJ SOLN
INTRAMUSCULAR | Status: DC | PRN
  Administered 2012-05-29: 50 ug via INTRAVENOUS

## 2012-05-29 MED ORDER — OXYCODONE-ACETAMINOPHEN 5-325 MG PO TABS: 1.0000 | ORAL_TABLET | Freq: Four times a day (QID) | ORAL | Status: DC | PRN

## 2012-05-29 MED ORDER — IBUPROFEN 200 MG PO TABS: 600.0000 mg | ORAL_TABLET | Freq: Four times a day (QID) | ORAL | Status: AC | PRN

## 2012-05-29 MED ORDER — PROPOFOL 10 MG/ML IV EMUL
INTRAVENOUS | Status: DC | PRN
  Administered 2012-05-29: 75 ug/kg/min via INTRAVENOUS

## 2012-05-29 MED ORDER — OXYCODONE-ACETAMINOPHEN 5-325 MG PO TABS
1.0000 | ORAL_TABLET | Freq: Once | ORAL | Status: AC
  Administered 2012-05-29: 1 via ORAL
  Filled 2012-05-29: qty 1

## 2012-05-29 MED ORDER — ONDANSETRON HCL 4 MG/2ML IJ SOLN
INTRAMUSCULAR | Status: DC | PRN
  Administered 2012-05-29: 4 mg via INTRAVENOUS

## 2012-05-29 MED ORDER — MIDAZOLAM HCL 5 MG/5ML IJ SOLN
INTRAMUSCULAR | Status: DC | PRN
  Administered 2012-05-29: 2 mg via INTRAVENOUS

## 2012-05-29 MED ORDER — FENTANYL CITRATE 0.05 MG/ML IJ SOLN
25.0000 ug | INTRAMUSCULAR | Status: DC | PRN
  Filled 2012-05-29: qty 1

## 2012-05-29 MED ORDER — KETOROLAC TROMETHAMINE 30 MG/ML IJ SOLN
INTRAMUSCULAR | Status: DC | PRN
  Administered 2012-05-29: 30 mg via INTRAVENOUS

## 2012-05-29 MED ORDER — DEXAMETHASONE SODIUM PHOSPHATE 4 MG/ML IJ SOLN
INTRAMUSCULAR | Status: DC | PRN
  Administered 2012-05-29: 10 mg via INTRAVENOUS

## 2012-05-29 SURGICAL SUPPLY — 31 items
CANISTER SUCTION 2500CC (MISCELLANEOUS) ×2 IMPLANT
CATH ROBINSON RED A/P 16FR (CATHETERS) ×2 IMPLANT
CLOTH BEACON ORANGE TIMEOUT ST (SAFETY) ×2 IMPLANT
CORD ACTIVE DISPOSABLE (ELECTRODE) ×1
CORD ELECTRO ACTIVE DISP (ELECTRODE) ×1 IMPLANT
COVER TABLE BACK 60X90 (DRAPES) ×2 IMPLANT
DRAPE CAMERA CLOSED 9X96 (DRAPES) ×2 IMPLANT
DRAPE LG THREE QUARTER DISP (DRAPES) ×2 IMPLANT
DRESSING TELFA 8X3 (GAUZE/BANDAGES/DRESSINGS) ×2 IMPLANT
ELECT LOOP GYNE PRO 24FR (CUTTING LOOP) ×2
ELECT REM PT RETURN 9FT ADLT (ELECTROSURGICAL) ×2
ELECT VAPORTRODE GRVD BAR (ELECTRODE) IMPLANT
ELECTRODE LOOP GYNE PRO 24FR (CUTTING LOOP) ×1 IMPLANT
ELECTRODE REM PT RTRN 9FT ADLT (ELECTROSURGICAL) ×1 IMPLANT
GLOVE BIO SURGEON STRL SZ 6.5 (GLOVE) ×5 IMPLANT
GLOVE ECLIPSE 6.0 STRL STRAW (GLOVE) ×1 IMPLANT
GLOVE ECLIPSE 6.5 STRL STRAW (GLOVE) ×1 IMPLANT
GOWN STRL NON-REIN LRG LVL3 (GOWN DISPOSABLE) ×4 IMPLANT
GOWN W/2 COTTON TOWELS 2 STD (GOWNS) ×2 IMPLANT
LEGGING LITHOTOMY PAIR STRL (DRAPES) ×2 IMPLANT
NDL SPNL 22GX3.5 QUINCKE BK (NEEDLE) IMPLANT
NEEDLE SPNL 22GX3.5 QUINCKE BK (NEEDLE) IMPLANT
PACK BASIN DAY SURGERY FS (CUSTOM PROCEDURE TRAY) ×2 IMPLANT
PAD OB MATERNITY 4.3X12.25 (PERSONAL CARE ITEMS) ×2 IMPLANT
PAD PREP 24X48 CUFFED NSTRL (MISCELLANEOUS) ×2 IMPLANT
SYR 20CC LL (SYRINGE) IMPLANT
SYR CONTROL 10ML LL (SYRINGE) IMPLANT
TOWEL OR 17X24 6PK STRL BLUE (TOWEL DISPOSABLE) ×4 IMPLANT
TRAY DSU PREP LF (CUSTOM PROCEDURE TRAY) ×2 IMPLANT
TUBING HYDROFLEX HYSTEROSCOPY (TUBING) ×2 IMPLANT
WATER STERILE IRR 500ML POUR (IV SOLUTION) ×2 IMPLANT

## 2012-05-29 NOTE — Brief Op Note (Signed)
05/29/2012  8:29 AM  PATIENT:  Felicia Sullivan  57 y.o. female  PRE-OPERATIVE DIAGNOSIS:   endometrial cells on pap, thickened EMS, 16109  POST-OPERATIVE DIAGNOSIS:   endometrial cells on pap, thickened EMS, 60454  PROCEDURE:  Procedure(s): DILATATION AND CURETTAGE /HYSTEROSCOPY (N/A)  SURGEON:  Surgeon(s) and Role:    * Sherron Monday, MD - Primary  ANESTHESIA:   IV sedation/MAC  EBL:  Total I/O In: 1000 [I.V.:1000] Out: 75 [Urine:75]  BLOOD ADMINISTERED:none  DRAINS: none   LOCAL MEDICATIONS USED:  LIDOCAINE   SPECIMEN:  Source of Specimen:  Endometrial Currettings  DISPOSITION OF SPECIMEN:  PATHOLOGY  COUNTS:  YES  TOURNIQUET:  * No tourniquets in log *  DICTATION: .Other Dictation: Dictation Number [id number]  PLAN OF CARE: Discharge to home after PACU  PATIENT DISPOSITION:  PACU - hemodynamically stable.   Delay start of Pharmacological VTE agent (>24hrs) due to surgical blood loss or risk of bleeding: not applicable

## 2012-05-29 NOTE — Anesthesia Postprocedure Evaluation (Signed)
  Anesthesia Post-op Note  Patient: Felicia Sullivan  Procedure(s) Performed: Procedure(s) (LRB): DILATATION AND CURETTAGE /HYSTEROSCOPY (N/A)  Patient Location: PACU  Anesthesia Type: MAC  Level of Consciousness: awake and alert   Airway and Oxygen Therapy: Patient Spontanous Breathing  Post-op Pain: mild  Post-op Assessment: Post-op Vital signs reviewed, Patient's Cardiovascular Status Stable, Respiratory Function Stable, Patent Airway and No signs of Nausea or vomiting  Last Vitals:  Filed Vitals:   05/29/12 0830  BP: 109/48  Pulse: 78  Temp:   Resp: 22    Post-op Vital Signs: stable   Complications: No apparent anesthesia complications

## 2012-05-29 NOTE — Transfer of Care (Signed)
Immediate Anesthesia Transfer of Care Note  Patient: Felicia Sullivan  Procedure(s) Performed: Procedure(s) (LRB): DILATATION AND CURETTAGE /HYSTEROSCOPY (N/A)  Patient Location: Patient transported to PACU with oxygen via face mask at 3 Liters / Min  Anesthesia Type: MAC  Level of Consciousness: awake and alert   Airway & Oxygen Therapy: Patient Spontanous Breathing and Patient connected to face mask oxygen  Post-op Assessment: Report given to PACU RN and Post -op Vital signs reviewed and stable  Post vital signs: Reviewed and stable  Complications: No apparent anesthesia complications

## 2012-06-01 ENCOUNTER — Encounter (HOSPITAL_BASED_OUTPATIENT_CLINIC_OR_DEPARTMENT_OTHER): Payer: Self-pay | Admitting: Obstetrics and Gynecology

## 2012-06-01 NOTE — Op Note (Signed)
NAME:  Felicia Sullivan, Felicia Sullivan                  ACCOUNT NO.:  [id number]  MEDICAL RECORD NO.:  [id number]  LOCATION:                                FACILITY:  [facility]  PHYSICIAN:  Sherron Monday, MD        DATE OF BIRTH:  July 24, 1955  DATE OF PROCEDURE:  05/29/2012 DATE OF DISCHARGE:                              OPERATIVE REPORT   PREOPERATIVE DIAGNOSIS:  Endometrial cells on Pap smear, thickened endometrial stripe on ultrasound.  POSTOPERATIVE DIAGNOSIS:  Endometrial cells on Pap smear, thickened endometrial stripe on ultrasound.  PROCEDURE:  Diagnostic hysteroscopy D and C.  SURGEON:  Sherron Monday, MD.  ANESTHESIA:  IV sedation/MAC.  EBL:  Minimal.  IV FLUIDS:  __________  URINE OUTPUT:  75cc prior to procedure.  PATHOLOGY:  Endometrial curettings to pathology.  COMPLICATIONS:  None.  DESCRIPTION OF PROCEDURE:  After informed consent was reviewed with the patient and her husband, she was given the endometrial cells on Pap smear in a postmenopausal woman and thickened endometrial stripe on ultrasound.  She had previously had an ablation performed by Dr. Nicholas Lose. This probably the reason for the thickened endometrial stripe, however, thought it was best to investigate.  She was transferred to the OR, placed on table in supine position.  General or MAC anesthesia was induced, found to be adequate.  She was then placed in [place] stirrups, prepped and draped in the normal sterile fashion.  Her bladder was sterilely drained.  Using an open-sided speculum, her cervix was easily visualized.  14 mL of 1% lidocaine with epinephrine was injected for paracervical block.  A single-tooth tenaculum was placed on the anterior lip of her cervix.  The cervix was dilated to accommodate a 21- [place] [employer], molecular.  The camera was introduced.  A brief uterine survey revealed some obliterated cavity from scar tissue from the previous ablation.  No obvious polyps or fibroids or abnormal tissue.  After this  brief survey, the scope was removed.  D and C was performed and sent the curettings to pathology.  The patient tolerated the procedure well.  Sponge, lap, and needle counts were correct x2 per the operating staff.     Sherron Monday, MD     JB/MEDQ  D:  05/29/2012  T:  05/29/2012  Job:  [id number]

## 2012-12-03 ENCOUNTER — Other Ambulatory Visit: Payer: Self-pay

## 2015-08-29 DIAGNOSIS — H524 Presbyopia: Secondary | ICD-10-CM | POA: Diagnosis not present

## 2016-04-11 MED FILL — diazePAM 5 MG TABS: 5 | 1 days supply | Qty: 1 | Fill #0

## 2016-04-11 MED FILL — HYDROCODON-APAP 5-325: 5-325 | 6 days supply | Qty: 24 | Fill #0

## 2016-04-11 MED FILL — IBUPROFEN 800 MG TABLET: 800 | 7 days supply | Qty: 21 | Fill #0

## 2016-04-11 MED FILL — CLINDAMYCIN HCL 300 MG CAP: 300 | 7 days supply | Qty: 28 | Fill #0

## 2016-09-09 DIAGNOSIS — H5213 Myopia, bilateral: Secondary | ICD-10-CM | POA: Diagnosis not present

## 2016-09-09 DIAGNOSIS — H524 Presbyopia: Secondary | ICD-10-CM | POA: Diagnosis not present

## 2017-02-20 MED FILL — NITROFURANTOIN MONO-MCR 100: 100 | 5 days supply | Qty: 10 | Fill #0

## 2017-05-02 ENCOUNTER — Other Ambulatory Visit: Payer: Self-pay | Admitting: Obstetrics and Gynecology

## 2017-05-02 DIAGNOSIS — Z1231 Encounter for screening mammogram for malignant neoplasm of breast: Secondary | ICD-10-CM

## 2017-05-07 DIAGNOSIS — Z6829 Body mass index (BMI) 29.0-29.9, adult: Secondary | ICD-10-CM | POA: Diagnosis not present

## 2017-05-07 DIAGNOSIS — Z9189 Other specified personal risk factors, not elsewhere classified: Secondary | ICD-10-CM | POA: Diagnosis not present

## 2017-05-07 DIAGNOSIS — Z01419 Encounter for gynecological examination (general) (routine) without abnormal findings: Secondary | ICD-10-CM | POA: Diagnosis not present

## 2017-05-07 DIAGNOSIS — Z79890 Hormone replacement therapy (postmenopausal): Secondary | ICD-10-CM | POA: Diagnosis not present

## 2017-05-07 DIAGNOSIS — Z1389 Encounter for screening for other disorder: Secondary | ICD-10-CM | POA: Diagnosis not present

## 2017-05-07 DIAGNOSIS — Z1231 Encounter for screening mammogram for malignant neoplasm of breast: Secondary | ICD-10-CM | POA: Diagnosis not present

## 2017-05-07 DIAGNOSIS — Z130 Encounter for screening for diseases of the blood and blood-forming organs and certain disorders involving the immune mechanism: Secondary | ICD-10-CM | POA: Diagnosis not present

## 2017-05-07 DIAGNOSIS — Z780 Asymptomatic menopausal state: Secondary | ICD-10-CM | POA: Diagnosis not present

## 2017-05-07 DIAGNOSIS — Z124 Encounter for screening for malignant neoplasm of cervix: Secondary | ICD-10-CM | POA: Diagnosis not present

## 2017-05-07 MED FILL — NITROFURANTOIN MCR 50 MG CA: 50 | 30 days supply | Qty: 30 | Fill #0

## 2017-05-23 ENCOUNTER — Ambulatory Visit: Payer: 59

## 2017-10-14 DIAGNOSIS — H524 Presbyopia: Secondary | ICD-10-CM | POA: Diagnosis not present

## 2017-10-14 DIAGNOSIS — H5213 Myopia, bilateral: Secondary | ICD-10-CM | POA: Diagnosis not present

## 2018-04-30 MED FILL — NITROFURANTOIN MACROCRYSTAL: 50 | 30 days supply | Qty: 30 | Fill #1

## 2018-08-31 MED FILL — CLINDAMYCIN HCL 300 MG CAPS: 300 | 7 days supply | Qty: 28 | Fill #0

## 2018-09-04 MED FILL — CHLORHEXIDINE 0.12% RINSE: 0.12 | 15 days supply | Qty: 473 | Fill #0

## 2018-09-04 MED FILL — IBUPROFEN 400 MG TABS: 400 | 7 days supply | Qty: 30 | Fill #0

## 2018-09-04 MED FILL — HYDROCODON-APAP 5-325: 5-325 | 2 days supply | Qty: 7 | Fill #0

## 2018-10-22 ENCOUNTER — Other Ambulatory Visit: Payer: Self-pay

## 2018-10-22 DIAGNOSIS — Z20822 Contact with and (suspected) exposure to covid-19: Secondary | ICD-10-CM

## 2018-10-23 LAB — NOVEL CORONAVIRUS, NAA: SARS-CoV-2, NAA: NOT DETECTED

## 2018-11-30 ENCOUNTER — Other Ambulatory Visit: Payer: Self-pay

## 2018-11-30 DIAGNOSIS — Z20822 Contact with and (suspected) exposure to covid-19: Secondary | ICD-10-CM

## 2018-12-01 LAB — NOVEL CORONAVIRUS, NAA: SARS-CoV-2, NAA: NOT DETECTED

## 2018-12-16 DIAGNOSIS — H52203 Unspecified astigmatism, bilateral: Secondary | ICD-10-CM | POA: Diagnosis not present

## 2018-12-16 DIAGNOSIS — H5213 Myopia, bilateral: Secondary | ICD-10-CM | POA: Diagnosis not present

## 2019-05-26 ENCOUNTER — Encounter: Payer: Self-pay | Admitting: Gastroenterology

## 2019-05-27 DIAGNOSIS — Z1231 Encounter for screening mammogram for malignant neoplasm of breast: Secondary | ICD-10-CM | POA: Diagnosis not present

## 2019-05-27 DIAGNOSIS — Z130 Encounter for screening for diseases of the blood and blood-forming organs and certain disorders involving the immune mechanism: Secondary | ICD-10-CM | POA: Diagnosis not present

## 2019-05-27 DIAGNOSIS — Z01419 Encounter for gynecological examination (general) (routine) without abnormal findings: Secondary | ICD-10-CM | POA: Diagnosis not present

## 2019-05-27 DIAGNOSIS — Z1279 Encounter for screening for malignant neoplasm of other genitourinary organs: Secondary | ICD-10-CM | POA: Diagnosis not present

## 2019-05-27 DIAGNOSIS — Z87440 Personal history of urinary (tract) infections: Secondary | ICD-10-CM | POA: Diagnosis not present

## 2019-05-27 DIAGNOSIS — Z6831 Body mass index (BMI) 31.0-31.9, adult: Secondary | ICD-10-CM | POA: Diagnosis not present

## 2019-05-27 MED FILL — NITROFURANTOIN MACROCRYSTAL: 50 | 30 days supply | Qty: 30 | Fill #0

## 2019-06-25 ENCOUNTER — Ambulatory Visit (AMBULATORY_SURGERY_CENTER): Payer: Self-pay | Admitting: *Deleted

## 2019-06-25 ENCOUNTER — Other Ambulatory Visit: Payer: Self-pay

## 2019-06-25 VITALS — Ht 65.0 in | Wt 190.0 lb

## 2019-06-25 DIAGNOSIS — Z1211 Encounter for screening for malignant neoplasm of colon: Secondary | ICD-10-CM

## 2019-06-25 MED ORDER — SUPREP BOWEL PREP KIT 17.5-3.13-1.6 GM/177ML PO SOLN: ORAL | 0 refills | Status: DC

## 2019-06-25 MED FILL — SUPREP BOWEL PREP KIT: 17.5-3.13-1 | 2 days supply | Qty: 354 | Fill #0

## 2019-06-25 NOTE — Progress Notes (Signed)
2nd covid vaccine given in January  Pt is aware that care partner will wait in the car during procedure; if they feel like they will be too hot or cold to wait in the car; they may wait in the 4 th floor lobby. Patient is aware to bring only one care partner. We want them to wear a mask (we do not have any that we can provide them), practice social distancing, and we will check their temperatures when they get here.  I did remind the patient that their care partner needs to stay in the parking lot the entire time and have a cell phone available, we will call them when the pt is ready for discharge. Patient will wear mask into building.   No egg or soy allergy  No home oxygen use   No medications for weight loss taken  emmi information given  Pt denies constipation issues    No trouble with anesthesia, difficulty moving neck, or hx/fam hx of malignant hyperthermia per pt

## 2019-06-30 DIAGNOSIS — K011 Impacted teeth: Secondary | ICD-10-CM | POA: Diagnosis not present

## 2019-07-01 ENCOUNTER — Encounter: Payer: Self-pay | Admitting: Gastroenterology

## 2019-07-11 ENCOUNTER — Encounter: Payer: Self-pay | Admitting: Certified Registered Nurse Anesthetist

## 2019-07-12 ENCOUNTER — Other Ambulatory Visit: Payer: Self-pay | Admitting: Gastroenterology

## 2019-07-12 ENCOUNTER — Other Ambulatory Visit: Payer: Self-pay

## 2019-07-12 ENCOUNTER — Ambulatory Visit (AMBULATORY_SURGERY_CENTER): Payer: 59 | Admitting: Gastroenterology

## 2019-07-12 ENCOUNTER — Encounter: Payer: Self-pay | Admitting: Gastroenterology

## 2019-07-12 VITALS — BP 117/73 | HR 59 | Temp 96.2°F | Resp 12 | Ht 65.0 in | Wt 190.0 lb

## 2019-07-12 DIAGNOSIS — Z1211 Encounter for screening for malignant neoplasm of colon: Secondary | ICD-10-CM | POA: Diagnosis not present

## 2019-07-12 DIAGNOSIS — D125 Benign neoplasm of sigmoid colon: Secondary | ICD-10-CM | POA: Diagnosis not present

## 2019-07-12 MED ORDER — SODIUM CHLORIDE 0.9 % IV SOLN: 500.0000 mL | Freq: Once | INTRAVENOUS | Status: DC

## 2019-07-12 NOTE — Progress Notes (Signed)
Report given to PACU, vss 

## 2019-07-12 NOTE — Op Note (Signed)
[facility] Patient Name: Felicia Sullivan Procedure Date: 07/12/2019 7:53 AM MRN: [id number] Endoscopist: Thornton Park MD, MD Age: 64 Referring MD:  Date of Birth: 1955-08-07 Gender: Female Account #: [id number] Procedure:                Colonoscopy Indications:              Screening for colorectal malignant neoplasm, This                            is the patient's first colonoscopy                           No known family history of colon cancer or polyps Medicines:                Monitored Anesthesia Care Procedure:                Pre-Anesthesia Assessment:                           - Prior to the procedure, a History and Physical                            was performed, and patient medications and                            allergies were reviewed. The patient's tolerance of                            previous anesthesia was also reviewed. The risks                            and benefits of the procedure and the sedation                            options and risks were discussed with the patient.                            All questions were answered, and informed consent                            was obtained. Prior Anticoagulants: The patient has                            taken no previous anticoagulant or antiplatelet                            agents. ASA Grade Assessment: I - A normal, healthy                            patient. After reviewing the risks and benefits,                            the patient was deemed in satisfactory condition to  undergo the procedure.                           After obtaining informed consent, the colonoscope                            was passed under direct vision. Throughout the                            procedure, the patient's blood pressure, pulse, and                            oxygen saturations were monitored continuously. The                            Colonoscope was introduced through the  anus and                            advanced to the 3 cm into the ileum. A second                            forward view of the right colon was performed. The                            colonoscopy was performed without difficulty. The                            patient tolerated the procedure well. The quality                            of the bowel preparation was good. The terminal                            ileum, ileocecal valve, appendiceal orifice, and                            rectum were photographed. Scope In: 8:06:19 AM Scope Out: 8:20:26 AM Scope Withdrawal Time: 0 hours 12 minutes 1 second  Total Procedure Duration: 0 hours 14 minutes 7 seconds  Findings:                 The perianal and digital rectal examinations were                            normal.                           Non-bleeding internal hemorrhoids were found. The                            hemorrhoids were small.                           Multiple small and large-mouthed diverticula were  found in the sigmoid colon, descending colon and                            ascending colon.                           Two flat polyps were found in the sigmoid colon.                            The polyps were 2 to 3 mm in size. These polyps                            were removed with a cold snare. Resection and                            retrieval were complete. Estimated blood loss was                            minimal.                           The exam was otherwise without abnormality on                            direct and retroflexion views. Complications:            No immediate complications. Estimated blood loss:                            Minimal. Estimated Blood Loss:     Estimated blood loss was minimal. Impression:               - Non-bleeding internal hemorrhoids.                           - Diverticulosis in the sigmoid colon, in the                            descending  colon and in the ascending colon.                           - Two 2 to 3 mm polyps in the sigmoid colon,                            removed with a cold snare. Resected and retrieved.                           - The examination was otherwise normal on direct                            and retroflexion views. Recommendation:           - Patient has a contact number available for                            emergencies.  The signs and symptoms of potential                            delayed complications were discussed with the                            patient. Return to normal activities tomorrow.                            Written discharge instructions were provided to the                            patient.                           - Follow a high fiber diet. Drink at least 64                            ounces of water daily. Add a daily stool bulking                            agent such as psyllium (an exampled would be                            Metamucil).                           - Continue present medications.                           - Await pathology results.                           - Repeat colonoscopy date to be determined after                            pending pathology results are reviewed for                            surveillance.                           - Emerging evidence supports eating a diet of                            fruits, vegetables, grains, calcium, and yogurt                            while reducing red meat and alcohol may reduce the                            risk of colon cancer.                           - Thank you for allowing me to be involved in your  colon cancer prevention. Thornton Park MD, MD 07/12/2019 8:24:38 AM This report has been signed electronically.

## 2019-07-12 NOTE — Patient Instructions (Addendum)
Follow a high fiber diet. Drink at least 64 ounces of water daily. Add a daily stool bulking agent such as psyllium (an example would be Metamucil).   Handouts provided on high-fiber diet, polyps, diverticulosis and hemorrhoids.   YOU HAD AN ENDOSCOPIC PROCEDURE TODAY AT [facility]:   Refer to the procedure report that was given to you for any specific questions about what was found during the examination.  If the procedure report does not answer your questions, please call your gastroenterologist to clarify.  If you requested that your care partner not be given the details of your procedure findings, then the procedure report has been included in a sealed envelope for you to review at your convenience later.  YOU SHOULD EXPECT: Some feelings of bloating in the abdomen. Passage of more gas than usual.  Walking can help get rid of the air that was put into your GI tract during the procedure and reduce the bloating. If you had a lower endoscopy (such as a colonoscopy or flexible sigmoidoscopy) you may notice spotting of blood in your stool or on the toilet paper. If you underwent a bowel prep for your procedure, you may not have a normal bowel movement for a few days.  Please Note:  You might notice some irritation and congestion in your nose or some drainage.  This is from the oxygen used during your procedure.  There is no need for concern and it should clear up in a day or so.  SYMPTOMS TO REPORT IMMEDIATELY:   Following lower endoscopy (colonoscopy or flexible sigmoidoscopy):  Excessive amounts of blood in the stool  Significant tenderness or worsening of abdominal pains  Swelling of the abdomen that is new, acute  Fever of 100F or higher  For urgent or emergent issues, a gastroenterologist can be reached at any hour by calling [phone]. Do not use MyChart messaging for urgent concerns.    DIET:  We do recommend a small meal at first, but then you may proceed to your  regular diet.  Drink plenty of fluids but you should avoid alcoholic beverages for 24 hours.  ACTIVITY:  You should plan to take it easy for the rest of today and you should NOT DRIVE or use heavy machinery until tomorrow (because of the sedation medicines used during the test).    FOLLOW UP: Our staff will call the number listed on your records 48-72 hours following your procedure to check on you and address any questions or concerns that you may have regarding the information given to you following your procedure. If we do not reach you, we will leave a message.  We will attempt to reach you two times.  During this call, we will ask if you have developed any symptoms of COVID 19. If you develop any symptoms (ie: fever, flu-like symptoms, shortness of breath, cough etc.) before then, please call [phone].  If you test positive for Covid 19 in the 2 weeks post procedure, please call and report this information to [place].    If any biopsies were taken you will be contacted by phone or by letter within the next 1-3 weeks.  Please call us at [phone] if you have not heard about the biopsies in 3 weeks.    SIGNATURES/CONFIDENTIALITY: You and/or your care partner have signed paperwork which will be entered into your electronic medical record.  These signatures attest to the fact that that the information above on your After Visit Summary has  been reviewed and is understood.  Full responsibility of the confidentiality of this discharge information lies with you and/or your care-partner.

## 2019-07-12 NOTE — Progress Notes (Signed)
Pt's states no medical or surgical changes since previsit or office visit. ° °Vs cw °

## 2019-07-14 ENCOUNTER — Telehealth: Payer: Self-pay

## 2019-07-14 NOTE — Telephone Encounter (Signed)
  Follow up Call-  Call back number 07/12/2019  Post procedure Call Back phone  # [phone]  Permission to leave phone message Yes  Some recent data might be hidden     Patient questions:  Do you have a fever, pain , or abdominal swelling? No. Pain Score  0 *  Have you tolerated food without any problems? Yes.    Have you been able to return to your normal activities? Yes.    Do you have any questions about your discharge instructions: Diet   No. Medications  No. Follow up visit  No.  Do you have questions or concerns about your Care? No.  Actions: * If pain score is 4 or above: 1. No action needed, pain <4.Have you developed a fever since your procedure? no  2.   Have you had an respiratory symptoms (SOB or cough) since your procedure? no  3.   Have you tested positive for COVID 19 since your procedure no  4.   Have you had any family members/close contacts diagnosed with the COVID 19 since your procedure?  no   If yes to any of these questions please route to Joylene John, RN and Erenest Rasher, RN

## 2019-07-16 ENCOUNTER — Encounter: Payer: Self-pay | Admitting: Gastroenterology

## 2019-07-30 MED FILL — CEPHALEXIN 500 MG CAPSULE: 500 | 3 days supply | Qty: 20 | Fill #0

## 2019-08-19 MED FILL — CLINDAMYCIN HCL 300 MG CAPS: 300 | 5 days supply | Qty: 20 | Fill #0

## 2019-08-19 MED FILL — CHLORHEXIDINE 0.12% RINSE: 0.12 | 14 days supply | Qty: 473 | Fill #0

## 2019-08-19 MED FILL — traMADol HCL 50 MG TABS: 50 | 2 days supply | Qty: 10 | Fill #0

## 2019-09-23 DIAGNOSIS — K08433 Partial loss of teeth due to caries, class III: Secondary | ICD-10-CM | POA: Diagnosis not present

## 2019-09-23 MED FILL — CEPHALEXIN 500 MG CAPSULE: 500 | 7 days supply | Qty: 21 | Fill #0

## 2019-09-23 MED FILL — CHLORHEXIDINE 0.12% RINSE: 0.12 | 14 days supply | Qty: 473 | Fill #0

## 2019-09-23 MED FILL — traMADol HCL 50 MG TABS: 50 | 2 days supply | Qty: 12 | Fill #0

## 2019-11-10 ENCOUNTER — Ambulatory Visit: Payer: 59

## 2019-11-19 ENCOUNTER — Ambulatory Visit: Payer: 59

## 2019-11-23 ENCOUNTER — Other Ambulatory Visit (HOSPITAL_COMMUNITY): Payer: Self-pay | Admitting: Oral Surgery

## 2019-11-23 MED FILL — CHLORHEXIDINE 0.12% RINSE: 0.12 | 15 days supply | Qty: 473 | Fill #0

## 2019-12-27 DIAGNOSIS — H5213 Myopia, bilateral: Secondary | ICD-10-CM | POA: Diagnosis not present

## 2019-12-27 DIAGNOSIS — H52203 Unspecified astigmatism, bilateral: Secondary | ICD-10-CM | POA: Diagnosis not present

## 2019-12-27 DIAGNOSIS — H524 Presbyopia: Secondary | ICD-10-CM | POA: Diagnosis not present

## 2020-02-21 DIAGNOSIS — K011 Impacted teeth: Secondary | ICD-10-CM | POA: Diagnosis not present

## 2020-03-16 ENCOUNTER — Other Ambulatory Visit (HOSPITAL_COMMUNITY): Payer: Self-pay | Admitting: Oral Surgery

## 2020-03-16 MED FILL — CEFDINIR 300 MG CAPS: 300 | 7 days supply | Qty: 14 | Fill #0

## 2020-03-16 MED FILL — traMADol HCL 50 MG TABS: 50 | 2 days supply | Qty: 12 | Fill #0

## 2020-03-16 MED FILL — CHLORHEXIDINE 0.12% RINSE: 0.12 | 15 days supply | Qty: 473 | Fill #0

## 2020-06-09 ENCOUNTER — Other Ambulatory Visit (HOSPITAL_COMMUNITY): Payer: Self-pay

## 2020-06-09 MED ORDER — CEFDINIR 300 MG PO CAPS
ORAL_CAPSULE | ORAL | 0 refills | Status: DC
  Filled 2020-06-09: qty 14, 7d supply, fill #0

## 2020-06-09 MED ORDER — CHLORHEXIDINE GLUCONATE 0.12 % MT SOLN
OROMUCOSAL | 0 refills | Status: DC
  Filled 2020-06-09: qty 473, 15d supply, fill #0

## 2020-06-09 MED ORDER — TRAMADOL HCL 50 MG PO TABS
ORAL_TABLET | ORAL | 0 refills | Status: DC
  Filled 2020-06-09: qty 12, 2d supply, fill #0

## 2020-06-27 ENCOUNTER — Other Ambulatory Visit (HOSPITAL_COMMUNITY): Payer: Self-pay

## 2020-06-27 MED ORDER — CARESTART COVID-19 HOME TEST VI KIT
PACK | 0 refills | Status: DC
  Filled 2020-06-27: qty 4, 4d supply, fill #0

## 2020-07-14 ENCOUNTER — Other Ambulatory Visit (HOSPITAL_COMMUNITY): Payer: Self-pay

## 2020-07-14 MED ORDER — CARESTART COVID-19 HOME TEST VI KIT
PACK | 0 refills | Status: DC
  Filled 2020-07-14: qty 4, 4d supply, fill #0

## 2020-09-11 ENCOUNTER — Other Ambulatory Visit (HOSPITAL_COMMUNITY): Payer: Self-pay

## 2020-09-11 MED ORDER — CARESTART COVID-19 HOME TEST VI KIT
PACK | 0 refills | Status: DC
  Filled 2020-09-11: qty 4, 4d supply, fill #0

## 2020-09-28 ENCOUNTER — Other Ambulatory Visit (HOSPITAL_COMMUNITY): Payer: Self-pay

## 2020-09-28 MED ORDER — CEFDINIR 300 MG PO CAPS
ORAL_CAPSULE | ORAL | 0 refills | Status: DC
  Filled 2020-09-28: qty 14, 7d supply, fill #0

## 2020-09-28 MED ORDER — CHLORHEXIDINE GLUCONATE 0.12 % MT SOLN
OROMUCOSAL | 0 refills | Status: DC
  Filled 2020-09-28: qty 473, 15d supply, fill #0

## 2020-09-28 MED ORDER — TRAMADOL HCL 50 MG PO TABS
ORAL_TABLET | ORAL | 0 refills | Status: DC
  Filled 2020-09-28: qty 12, 2d supply, fill #0

## 2020-11-02 ENCOUNTER — Ambulatory Visit: Payer: 59 | Attending: Internal Medicine

## 2020-11-02 ENCOUNTER — Ambulatory Visit: Payer: 59

## 2020-11-02 DIAGNOSIS — Z23 Encounter for immunization: Secondary | ICD-10-CM

## 2020-11-02 NOTE — Progress Notes (Signed)
   Covid-19 Vaccination Clinic  Name:  Felicia Sullivan    MRN: [id number] DOB: 1956/01/04  11/02/2020  [name] was observed post Covid-19 immunization for 15 minutes without incident. She was provided with Vaccine Information Sheet and instruction to access the V-Safe system.   [name] was instructed to call 911 with any severe reactions post vaccine: Difficulty breathing  Swelling of face and throat  A fast heartbeat  A bad rash all over body  Dizziness and weakness

## 2020-11-10 ENCOUNTER — Other Ambulatory Visit (HOSPITAL_BASED_OUTPATIENT_CLINIC_OR_DEPARTMENT_OTHER): Payer: Self-pay

## 2020-11-10 MED ORDER — MODERNA COVID-19 BIVAL BOOSTER 50 MCG/0.5ML IM SUSP
INTRAMUSCULAR | 0 refills | Status: DC
  Filled 2020-11-10: qty 0.5, 1d supply, fill #0

## 2020-11-16 ENCOUNTER — Other Ambulatory Visit (HOSPITAL_COMMUNITY): Payer: Self-pay

## 2020-11-16 MED ORDER — SODIUM FLUORIDE 1.1 % DT CREA
TOPICAL_CREAM | DENTAL | 4 refills | Status: DC
  Filled 2020-11-16: qty 51, 30d supply, fill #0

## 2020-12-27 DIAGNOSIS — H52203 Unspecified astigmatism, bilateral: Secondary | ICD-10-CM | POA: Diagnosis not present

## 2020-12-27 DIAGNOSIS — H524 Presbyopia: Secondary | ICD-10-CM | POA: Diagnosis not present

## 2020-12-27 DIAGNOSIS — H5213 Myopia, bilateral: Secondary | ICD-10-CM | POA: Diagnosis not present

## 2021-01-24 ENCOUNTER — Other Ambulatory Visit (HOSPITAL_COMMUNITY): Payer: Self-pay

## 2021-01-24 MED ORDER — CEFDINIR 300 MG PO CAPS
ORAL_CAPSULE | ORAL | 0 refills | Status: DC
  Filled 2021-01-24: qty 14, 7d supply, fill #0

## 2021-01-24 MED ORDER — TRAMADOL HCL 50 MG PO TABS
ORAL_TABLET | ORAL | 0 refills | Status: DC
  Filled 2021-01-24: qty 12, 2d supply, fill #0

## 2021-02-01 DIAGNOSIS — K011 Impacted teeth: Secondary | ICD-10-CM | POA: Diagnosis not present

## 2021-04-26 ENCOUNTER — Other Ambulatory Visit (HOSPITAL_COMMUNITY): Payer: Self-pay

## 2021-04-26 DIAGNOSIS — K08419 Partial loss of teeth due to trauma, unspecified class: Secondary | ICD-10-CM | POA: Diagnosis not present

## 2021-04-26 MED ORDER — CHLORHEXIDINE GLUCONATE 0.12 % MT SOLN
OROMUCOSAL | 2 refills | Status: DC
  Filled 2021-04-26: qty 473, 15d supply, fill #0

## 2021-04-26 MED ORDER — CEFDINIR 300 MG PO CAPS
ORAL_CAPSULE | ORAL | 0 refills | Status: DC
  Filled 2021-04-26: qty 14, 7d supply, fill #0

## 2021-04-26 MED ORDER — TRAMADOL HCL 50 MG PO TABS
ORAL_TABLET | ORAL | 0 refills | Status: DC
  Filled 2021-04-26: qty 14, 2d supply, fill #0

## 2021-08-22 DIAGNOSIS — Z0289 Encounter for other administrative examinations: Secondary | ICD-10-CM

## 2021-09-05 ENCOUNTER — Encounter (INDEPENDENT_AMBULATORY_CARE_PROVIDER_SITE_OTHER): Payer: Self-pay

## 2021-09-05 ENCOUNTER — Encounter (INDEPENDENT_AMBULATORY_CARE_PROVIDER_SITE_OTHER): Payer: Self-pay | Admitting: Bariatrics

## 2021-09-05 ENCOUNTER — Ambulatory Visit (INDEPENDENT_AMBULATORY_CARE_PROVIDER_SITE_OTHER): Payer: 59 | Admitting: Bariatrics

## 2021-09-05 VITALS — BP 116/62 | HR 64 | Temp 97.9°F | Ht 66.0 in | Wt 200.0 lb

## 2021-09-05 DIAGNOSIS — R5383 Other fatigue: Secondary | ICD-10-CM

## 2021-09-05 DIAGNOSIS — E669 Obesity, unspecified: Secondary | ICD-10-CM | POA: Insufficient documentation

## 2021-09-05 DIAGNOSIS — R7309 Other abnormal glucose: Secondary | ICD-10-CM | POA: Insufficient documentation

## 2021-09-05 DIAGNOSIS — Z0000 Encounter for general adult medical examination without abnormal findings: Secondary | ICD-10-CM | POA: Insufficient documentation

## 2021-09-05 DIAGNOSIS — Z1331 Encounter for screening for depression: Secondary | ICD-10-CM | POA: Diagnosis not present

## 2021-09-05 DIAGNOSIS — E559 Vitamin D deficiency, unspecified: Secondary | ICD-10-CM | POA: Diagnosis not present

## 2021-09-05 DIAGNOSIS — Z6832 Body mass index (BMI) 32.0-32.9, adult: Secondary | ICD-10-CM | POA: Diagnosis not present

## 2021-09-05 DIAGNOSIS — R0602 Shortness of breath: Secondary | ICD-10-CM | POA: Diagnosis not present

## 2021-09-06 ENCOUNTER — Other Ambulatory Visit (HOSPITAL_COMMUNITY): Payer: Self-pay

## 2021-09-06 DIAGNOSIS — Z1231 Encounter for screening mammogram for malignant neoplasm of breast: Secondary | ICD-10-CM | POA: Diagnosis not present

## 2021-09-06 DIAGNOSIS — N341 Nonspecific urethritis: Secondary | ICD-10-CM | POA: Diagnosis not present

## 2021-09-06 DIAGNOSIS — Z0142 Encounter for cervical smear to confirm findings of recent normal smear following initial abnormal smear: Secondary | ICD-10-CM | POA: Diagnosis not present

## 2021-09-06 DIAGNOSIS — Z124 Encounter for screening for malignant neoplasm of cervix: Secondary | ICD-10-CM | POA: Diagnosis not present

## 2021-09-06 DIAGNOSIS — Z6833 Body mass index (BMI) 33.0-33.9, adult: Secondary | ICD-10-CM | POA: Diagnosis not present

## 2021-09-06 DIAGNOSIS — Z1151 Encounter for screening for human papillomavirus (HPV): Secondary | ICD-10-CM | POA: Diagnosis not present

## 2021-09-06 DIAGNOSIS — N9089 Other specified noninflammatory disorders of vulva and perineum: Secondary | ICD-10-CM | POA: Diagnosis not present

## 2021-09-06 DIAGNOSIS — Z01419 Encounter for gynecological examination (general) (routine) without abnormal findings: Secondary | ICD-10-CM | POA: Diagnosis not present

## 2021-09-06 DIAGNOSIS — Z130 Encounter for screening for diseases of the blood and blood-forming organs and certain disorders involving the immune mechanism: Secondary | ICD-10-CM | POA: Diagnosis not present

## 2021-09-06 DIAGNOSIS — Z1272 Encounter for screening for malignant neoplasm of vagina: Secondary | ICD-10-CM | POA: Diagnosis not present

## 2021-09-06 LAB — LIPID PANEL WITH LDL/HDL RATIO
Cholesterol, Total: 206 mg/dL — ABNORMAL HIGH (ref 100–199)
HDL: 85 mg/dL (ref >39)
LDL Chol Calc (NIH): 110 mg/dL — ABNORMAL HIGH (ref 0–99)
LDL/HDL Ratio: 1.3 ratio (ref 0.0–3.2)
Triglycerides: 58 mg/dL (ref 0–149)
VLDL Cholesterol Cal: 11 mg/dL (ref 5–40)

## 2021-09-06 LAB — COMPREHENSIVE METABOLIC PANEL
ALT: 25 IU/L (ref 0–32)
AST: 23 IU/L (ref 0–40)
Albumin/Globulin Ratio: 1.9 (ref 1.2–2.2)
Albumin: 4.7 g/dL (ref 3.9–4.9)
Alkaline Phosphatase: 94 IU/L (ref 44–121)
BUN/Creatinine Ratio: 21 (ref 12–28)
BUN: 15 mg/dL (ref 8–27)
Bilirubin Total: 0.7 mg/dL (ref 0.0–1.2)
CO2: 23 mmol/L (ref 20–29)
Calcium: 9.5 mg/dL (ref 8.7–10.3)
Chloride: 103 mmol/L (ref 96–106)
Creatinine, Ser: 0.73 mg/dL (ref 0.57–1.00)
Globulin, Total: 2.5 g/dL (ref 1.5–4.5)
Glucose: 101 mg/dL — ABNORMAL HIGH (ref 70–99)
Potassium: 4.8 mmol/L (ref 3.5–5.2)
Sodium: 142 mmol/L (ref 134–144)
Total Protein: 7.2 g/dL (ref 6.0–8.5)
eGFR: 91 mL/min/{1.73_m2} (ref >59)

## 2021-09-06 LAB — VITAMIN D 25 HYDROXY (VIT D DEFICIENCY, FRACTURES): Vit D, 25-Hydroxy: 27.4 ng/mL — ABNORMAL LOW (ref 30.0–100.0)

## 2021-09-06 LAB — TSH+T4F+T3FREE
Free T4: 1.16 ng/dL (ref 0.82–1.77)
T3, Free: 3.2 pg/mL (ref 2.0–4.4)
TSH: 0.844 u[IU]/mL (ref 0.450–4.500)

## 2021-09-06 LAB — HEMOGLOBIN A1C
Est. average glucose Bld gHb Est-mCnc: 111 mg/dL
Hgb A1c MFr Bld: 5.5 % (ref 4.8–5.6)

## 2021-09-06 LAB — INSULIN, RANDOM: INSULIN: 14.9 u[IU]/mL (ref 2.6–24.9)

## 2021-09-06 LAB — HM MAMMOGRAPHY

## 2021-09-06 MED ORDER — NITROFURANTOIN MONOHYD MACRO 100 MG PO CAPS
ORAL_CAPSULE | ORAL | 1 refills | Status: DC
  Filled 2021-09-06: qty 14, 7d supply, fill #0
  Filled 2022-08-28: qty 14, 7d supply, fill #1

## 2021-09-06 MED ORDER — NYSTATIN 100000 UNIT/GM EX CREA
TOPICAL_CREAM | CUTANEOUS | 2 refills | Status: DC
  Filled 2021-09-06: qty 30, 14d supply, fill #0
  Filled 2022-08-02 – 2022-08-05 (×2): qty 30, 14d supply, fill #1
  Filled 2022-08-28: qty 30, 14d supply, fill #2

## 2021-09-06 MED ORDER — TRIAMCINOLONE ACETONIDE 0.1 % EX CREA
TOPICAL_CREAM | CUTANEOUS | 2 refills | Status: DC
  Filled 2021-09-06: qty 30, 14d supply, fill #0
  Filled 2022-08-02 – 2022-08-05 (×2): qty 30, 14d supply, fill #1
  Filled 2022-08-28: qty 30, 14d supply, fill #2

## 2021-09-10 LAB — RESULTS CONSOLE HPV: CHL HPV: NEGATIVE

## 2021-09-12 NOTE — Progress Notes (Signed)
Chief Complaint:   OBESITY Felicia Sullivan (MR# [id number]) is a 66 y.o. female who presents for evaluation and treatment of obesity and related comorbidities. Current BMI is Body mass index is 32.28 kg/m. Felicia Sullivan has been struggling with her weight for many years and has been unsuccessful in either losing weight, maintaining weight loss, or reaching her healthy weight goal.  Felicia Sullivan does like to cook but notes fatigue after work as an obstacle.  She snacks at night.  Felicia Sullivan is currently in the action stage of change and ready to dedicate time achieving and maintaining a healthier weight. Felicia Sullivan is interested in becoming our patient and working on intensive lifestyle modifications including (but not limited to) diet and exercise for weight loss.  Felicia Sullivan habits were reviewed today and are as follows: Her family eats meals together, she thinks her family will eat healthier with her, her desired weight loss is 50 lbs, she has been heavy most of her life, she started gaining weight as she struggled all her life, her heaviest weight ever was 219 pounds, she has significant food cravings issues, she snacks frequently in the evenings, she is frequently drinking liquids with calories, she frequently makes poor food choices, she has problems with excessive hunger, and she struggles with emotional eating.  Depression Screen Felicia Sullivan's Food and Mood (modified PHQ-9) score was 11.     09/05/2021    7:01 AM  Depression screen PHQ 2/9  Decreased Interest 1  Down, Depressed, Hopeless 2  PHQ - 2 Score 3  Altered sleeping 0  Tired, decreased energy 3  Change in appetite 1  Feeling bad or failure about yourself  3  Trouble concentrating 1  Moving slowly or fidgety/restless 0  Suicidal thoughts 0  PHQ-9 Score 11  Difficult doing work/chores Not difficult at all   Subjective:   1. Other fatigue Felicia Sullivan admits to daytime somnolence and admits to waking up still tired. Patient has a history of symptoms of daytime  fatigue and morning fatigue. Felicia Sullivan generally gets 5 or 6 hours of sleep per night, and states that she has generally restful sleep. Snoring is not present. Apneic episodes are not present. Epworth Sleepiness Score is 11.   2. SOB (shortness of breath) on exertion Felicia Sullivan notes increasing shortness of breath with exercising and seems to be worsening over time with weight gain. She notes getting out of breath sooner with activity than she used to. This has not gotten worse recently. Felicia Sullivan denies shortness of breath at rest or orthopnea.  3. Vitamin D deficiency Felicia Sullivan is not on Vitamin D currently.   4. Health care maintenance Given obesity.   5. Elevated glucose Felicia Sullivan is not on medications currently.  Assessment/Plan:   1. Other fatigue Felicia Sullivan does feel that her weight is causing her energy to be lower than it should be. Fatigue may be related to obesity, depression or many other causes. Labs will be ordered, and in the meanwhile, Felicia Sullivan will focus on self care including making healthy food choices, increasing physical activity and focusing on stress reduction.  - EKG 12-Lead - TSH+T4F+T3Free  2. SOB (shortness of breath) on exertion Felicia Sullivan does feel that she gets out of breath more easily that she used to when she exercises. Felicia Sullivan's shortness of breath appears to be obesity related and exercise induced. She has agreed to work on weight loss and gradually increase exercise to treat her exercise induced shortness of breath. Will continue to monitor closely.  - TSH+T4F+T3Free  3. Vitamin D deficiency We will check labs today, and we will follow-up at [facility] next visit.  - VITAMIN D 25 Hydroxy (Vit-D Deficiency, Fractures)  4. Health care maintenance We will check labs today. EKG and IC were done today and results were reviewed with the patient.  - Comprehensive metabolic panel - Lipid Panel With LDL/HDL Ratio - Insulin, random - Hemoglobin A1c - TSH+T4F+T3Free - VITAMIN D 25 Hydroxy (Vit-D  Deficiency, Fractures)  5. Elevated glucose We will check labs today, and we will [facility] at [facility] next visit.  - Insulin, random - Hemoglobin A1c  6. Depression screening Felicia Sullivan had a positive depression screening. Depression is commonly associated with obesity and often results in emotional eating behaviors. We will monitor this closely and work on CBT to help improve the non-hunger eating patterns. Referral to Psychology may be required if no improvement is seen as she continues in our clinic.  7. Obesity, Current BMI 32.4 Felicia Sullivan is currently in the action stage of change and her goal is to continue with weight loss efforts. I recommend Felicia Sullivan begin the structured treatment plan as follows:  She has agreed to the Category 2 Plan.  Meal planning and intentional eating were discussed.  She will decrease her sugar intake.  Exercise goals: No exercise has been prescribed at this time.   Behavioral modification strategies: increasing lean protein intake, decreasing simple carbohydrates, increasing vegetables, increasing water intake, decreasing eating out, no skipping meals, meal planning and cooking strategies, keeping healthy foods in the home, and planning for success.  She was informed of the importance of frequent follow-up visits to maximize her success with intensive lifestyle modifications for her multiple health conditions. She was informed we would discuss her lab results at her next visit unless there is a critical issue that needs to be addressed sooner. Felicia Sullivan agreed to keep her next visit at the agreed upon time to discuss these results.  Objective:   Blood pressure 116/62, pulse 64, temperature 97.9 F (36.6 C), height '5\' 6"'$  (1.676 m), weight 200 lb (90.7 kg), SpO2 97 %. Body mass index is 32.28 kg/m.  EKG: Normal sinus rhythm, rate 66 BPM.  Indirect Calorimeter completed today shows a VO2 of 220 and a REE of 1512.  Her calculated basal metabolic rate is 6387 thus her basal  metabolic rate is worse than expected.  General: Cooperative, alert, well developed, in no acute distress. HEENT: Conjunctivae and lids unremarkable. Cardiovascular: Regular rhythm.  Lungs: Normal work of breathing. Neurologic: No focal deficits.   Lab Results  Component Value Date   CREATININE 0.73 09/05/2021   BUN 15 09/05/2021   NA 142 09/05/2021   K 4.8 09/05/2021   CL 103 09/05/2021   CO2 23 09/05/2021   Lab Results  Component Value Date   ALT 25 09/05/2021   AST 23 09/05/2021   ALKPHOS 94 09/05/2021   BILITOT 0.7 09/05/2021   Lab Results  Component Value Date   HGBA1C 5.5 09/05/2021   Lab Results  Component Value Date   INSULIN 14.9 09/05/2021   Lab Results  Component Value Date   TSH 0.844 09/05/2021   Lab Results  Component Value Date   CHOL 206 (H) 09/05/2021   HDL 85 09/05/2021   LDLCALC 110 (H) 09/05/2021   TRIG 58 09/05/2021   Lab Results  Component Value Date   WBC 6.2 05/29/2012   HGB 14.0 05/29/2012   HCT 39.5 05/29/2012   MCV 90.2 05/29/2012   PLT 286 05/29/2012  No results found for: "IRON", "TIBC", "FERRITIN"  Attestation Statements:   Reviewed by clinician on day of visit: allergies, medications, problem list, medical history, surgical history, family history, social history, and previous encounter notes.   Wilhemena Durie, am acting as Location manager for [employer], DO.  I have reviewed the above documentation for accuracy and completeness, and I agree with the above. Jearld Lesch, DO

## 2021-09-17 ENCOUNTER — Encounter (INDEPENDENT_AMBULATORY_CARE_PROVIDER_SITE_OTHER): Payer: Self-pay | Admitting: Bariatrics

## 2021-09-17 LAB — HM PAP SMEAR: HM Pap smear: NORMAL

## 2021-09-19 ENCOUNTER — Encounter (INDEPENDENT_AMBULATORY_CARE_PROVIDER_SITE_OTHER): Payer: Self-pay | Admitting: Bariatrics

## 2021-09-19 ENCOUNTER — Other Ambulatory Visit (HOSPITAL_COMMUNITY): Payer: Self-pay

## 2021-09-19 ENCOUNTER — Ambulatory Visit (INDEPENDENT_AMBULATORY_CARE_PROVIDER_SITE_OTHER): Payer: 59 | Admitting: Bariatrics

## 2021-09-19 VITALS — BP 110/72 | HR 63 | Temp 97.9°F | Ht 66.0 in | Wt 195.0 lb

## 2021-09-19 DIAGNOSIS — E669 Obesity, unspecified: Secondary | ICD-10-CM | POA: Diagnosis not present

## 2021-09-19 DIAGNOSIS — E8881 Metabolic syndrome: Secondary | ICD-10-CM

## 2021-09-19 DIAGNOSIS — Z6831 Body mass index (BMI) 31.0-31.9, adult: Secondary | ICD-10-CM | POA: Diagnosis not present

## 2021-09-19 DIAGNOSIS — E7800 Pure hypercholesterolemia, unspecified: Secondary | ICD-10-CM

## 2021-09-19 DIAGNOSIS — E559 Vitamin D deficiency, unspecified: Secondary | ICD-10-CM | POA: Diagnosis not present

## 2021-09-19 MED ORDER — VITAMIN D (ERGOCALCIFEROL) 1.25 MG (50000 UNIT) PO CAPS
50000.0000 [IU] | ORAL_CAPSULE | ORAL | 0 refills | Status: DC
  Filled 2021-09-19: qty 5, 35d supply, fill #0

## 2021-09-24 NOTE — Progress Notes (Unsigned)
Chief Complaint:   OBESITY Felicia Sullivan is here to discuss her progress with her obesity treatment plan along with follow-up of her obesity related diagnoses. Felicia Sullivan is on the Category 2 Plan and states she is following her eating plan approximately 98% of the time. Felicia Sullivan states she is walking for 30 minutes 4 times per week.  Today's visit was #: 2 Starting weight: 200 lbs Starting date: 09/05/21 Today's weight: 195 lbs Today's date: 09/19/21 Total lbs lost to date: 5 Total lbs lost since last in-office visit: 5  Interim History: She is down 5 pounds since her first visit.  Subjective:   1. Vitamin D insufficiency Vitamin D level is 27.4  2. Elevated cholesterol Total cholesterol is 206, LDL is 110.  3. Insulin resistance (mild) Insulin level is 14.9.  Assessment/Plan:   1. Vitamin D insufficiency Prescription - Vitamin D, Ergocalciferol, (DRISDOL) 1.25 MG (50000 UNIT) CAPS capsule; Take 1 capsule (50,000 Units total) by mouth every 7 (seven) days.  Dispense: 5 capsule; Refill: 0  2. Elevated cholesterol 1.  No trans fats 2.Increase MUFAs and PUFAs.  3. Insulin resistance .  Handout for prediabetes and insulin resistance.  4. Obesity, Current BMI 31.6 1.  Meal planning 2.  Will continue to adhere closely to the plan 3.  Reviewed labs from 09/05/2021-CMP, lipids, thyroid panel, vitamin D, HgbA1c, insulin.  [name] is currently in the action stage of change. As such, her goal is to continue with weight loss efforts. She has agreed to the Category 2 Plan.   Exercise goals: We will continue to walk.  Behavioral modification strategies: increasing lean protein intake, decreasing simple carbohydrates, increasing vegetables, increasing water intake, decreasing eating out, no skipping meals, meal planning and cooking strategies, keeping healthy foods in the home, and planning for success.  Felicia Sullivan has agreed to follow-up with our clinic in 2-3 weeks with Dr. Valetta Close. She was informed of  the importance of frequent follow-up visits to maximize her success with intensive lifestyle modifications for her multiple health conditions.   Objective:   Blood pressure 110/72, pulse 63, temperature 97.9 F (36.6 C), height '5\' 6"'$  (1.676 m), weight 195 lb (88.5 kg), SpO2 98 %. Body mass index is 31.47 kg/m.  General: Cooperative, alert, well developed, in no acute distress. HEENT: Conjunctivae and lids unremarkable. Cardiovascular: Regular rhythm.  Lungs: Normal work of breathing. Neurologic: No focal deficits.   Lab Results  Component Value Date   CREATININE 0.73 09/05/2021   BUN 15 09/05/2021   NA 142 09/05/2021   K 4.8 09/05/2021   CL 103 09/05/2021   CO2 23 09/05/2021   Lab Results  Component Value Date   ALT 25 09/05/2021   AST 23 09/05/2021   ALKPHOS 94 09/05/2021   BILITOT 0.7 09/05/2021   Lab Results  Component Value Date   HGBA1C 5.5 09/05/2021   Lab Results  Component Value Date   INSULIN 14.9 09/05/2021   Lab Results  Component Value Date   TSH 0.844 09/05/2021   Lab Results  Component Value Date   CHOL 206 (H) 09/05/2021   HDL 85 09/05/2021   LDLCALC 110 (H) 09/05/2021   TRIG 58 09/05/2021   Lab Results  Component Value Date   VD25OH 27.4 (L) 09/05/2021   Lab Results  Component Value Date   WBC 6.2 05/29/2012   HGB 14.0 05/29/2012   HCT 39.5 05/29/2012   MCV 90.2 05/29/2012   PLT 286 05/29/2012   No results found for: "IRON", "  TIBC", "FERRITIN"   Attestation Statements:   Reviewed by clinician on day of visit: allergies, medications, problem list, medical history, surgical history, family history, social history, and previous encounter notes.  I, Dawn Whitmire, FNP-C, am acting as transcriptionist for Dr. Jearld Lesch.  I have reviewed the above documentation for accuracy and completeness, and I agree with the above. Jearld Lesch, DO

## 2021-09-25 ENCOUNTER — Encounter (INDEPENDENT_AMBULATORY_CARE_PROVIDER_SITE_OTHER): Payer: Self-pay | Admitting: Bariatrics

## 2021-10-08 ENCOUNTER — Encounter (INDEPENDENT_AMBULATORY_CARE_PROVIDER_SITE_OTHER): Payer: Self-pay | Admitting: Nurse Practitioner

## 2021-10-08 ENCOUNTER — Ambulatory Visit (INDEPENDENT_AMBULATORY_CARE_PROVIDER_SITE_OTHER): Payer: 59 | Admitting: Nurse Practitioner

## 2021-10-08 ENCOUNTER — Other Ambulatory Visit (HOSPITAL_COMMUNITY): Payer: Self-pay

## 2021-10-08 VITALS — BP 109/75 | HR 71 | Temp 98.1°F | Ht 66.0 in

## 2021-10-08 DIAGNOSIS — E669 Obesity, unspecified: Secondary | ICD-10-CM

## 2021-10-08 DIAGNOSIS — E66811 Obesity, class 1: Secondary | ICD-10-CM

## 2021-10-08 DIAGNOSIS — E559 Vitamin D deficiency, unspecified: Secondary | ICD-10-CM | POA: Diagnosis not present

## 2021-10-08 DIAGNOSIS — Z6831 Body mass index (BMI) 31.0-31.9, adult: Secondary | ICD-10-CM

## 2021-10-08 MED ORDER — VITAMIN D (ERGOCALCIFEROL) 1.25 MG (50000 UNIT) PO CAPS
50000.0000 [IU] | ORAL_CAPSULE | ORAL | 0 refills | Status: DC
  Filled 2021-10-08 – 2021-10-17 (×3): qty 5, 35d supply, fill #0

## 2021-10-10 NOTE — Progress Notes (Signed)
Chief Complaint:   OBESITY Felicia Sullivan is here to discuss her progress with her obesity treatment plan along with follow-up of her obesity related diagnoses. Felicia Sullivan is on the Category 2 Plan and states she is following her eating plan approximately 98% of the time. Felicia Sullivan states she is walking 30 minutes 4-5 times per week.  Today's visit was #: 3 Starting weight: 200 lbs Starting date: 09/05/2021 Today's weight: 191 lb Today's date: 10/08/2021 Total lbs lost to date: 9 lbs Total lbs lost since last in-office visit: 4  Interim History: Felicia Sullivan has done well with weight loss since her last visit. Doing well with the meal plan. Meeting protein goals. Craves sweets. Snacks in the evening after dinner: granola bar, rice cakes 100 calorie snacks and [place] bars. Occasional hunger. Drinks water and coffee.  Subjective:   1. Vitamin D insufficiency Felicia Sullivan is currently taking prescription Vit D 50,000 IU once a week.   Assessment/Plan:   1. Vitamin D insufficiency We will refill Vit D 50k IU once weekly for 1 month with 0 refills. Low Vitamin D level contributes to fatigue and are associated with obesity, breast, and colon cancer. She agrees to continue to take prescription Vitamin D '@50'$ ,000 IU every week and will follow-up for routine testing of Vitamin D, at least 2-3 times per year to avoid over-replacement.   -Refill Vitamin D, Ergocalciferol, (DRISDOL) 1.25 MG (50000 UNIT) CAPS capsule; Take 1 capsule (50,000 Units total) by mouth every 7 (seven) days.  Dispense: 5 capsule; Refill: 0  2. Obesity, Current BMI 31.0 Felicia Sullivan is currently in the action stage of change. As such, her goal is to continue with weight loss efforts. She has agreed to the Category 2 Plan.   Exercise goals: As is.  Behavioral modification strategies: increasing lean protein intake, increasing vegetables, and increasing water intake.  Felicia Sullivan has agreed to follow-up with our clinic in 3 weeks. She was informed of the importance of  frequent follow-up visits to maximize her success with intensive lifestyle modifications for her multiple health conditions.   Objective:   Blood pressure 109/75, pulse 71, temperature 98.1 F (36.7 C), height '5\' 6"'$  (1.676 m), SpO2 98 %. Body mass index is 31.47 kg/m.  General: Cooperative, alert, well developed, in no acute distress. HEENT: Conjunctivae and lids unremarkable. Cardiovascular: Regular rhythm.  Lungs: Normal work of breathing. Neurologic: No focal deficits.   Lab Results  Component Value Date   CREATININE 0.73 09/05/2021   BUN 15 09/05/2021   NA 142 09/05/2021   K 4.8 09/05/2021   CL 103 09/05/2021   CO2 23 09/05/2021   Lab Results  Component Value Date   ALT 25 09/05/2021   AST 23 09/05/2021   ALKPHOS 94 09/05/2021   BILITOT 0.7 09/05/2021   Lab Results  Component Value Date   HGBA1C 5.5 09/05/2021   Lab Results  Component Value Date   INSULIN 14.9 09/05/2021   Lab Results  Component Value Date   TSH 0.844 09/05/2021   Lab Results  Component Value Date   CHOL 206 (H) 09/05/2021   HDL 85 09/05/2021   LDLCALC 110 (H) 09/05/2021   TRIG 58 09/05/2021   Lab Results  Component Value Date   VD25OH 27.4 (L) 09/05/2021   Lab Results  Component Value Date   WBC 6.2 05/29/2012   HGB 14.0 05/29/2012   HCT 39.5 05/29/2012   MCV 90.2 05/29/2012   PLT 286 05/29/2012   No results found for: "IRON", "TIBC", "  FERRITIN"  Attestation Statements:   Reviewed by clinician on day of visit: allergies, medications, problem list, medical history, surgical history, family history, social history, and previous encounter notes.  I, Brendell Tyus, RMA, am acting as transcriptionist for Everardo Pacific, FNP.  I have reviewed the above documentation for accuracy and completeness, and I agree with the above. Everardo Pacific, FNP

## 2021-10-13 ENCOUNTER — Other Ambulatory Visit (HOSPITAL_COMMUNITY): Payer: Self-pay

## 2021-10-17 ENCOUNTER — Other Ambulatory Visit (HOSPITAL_COMMUNITY): Payer: Self-pay

## 2021-10-29 ENCOUNTER — Ambulatory Visit (INDEPENDENT_AMBULATORY_CARE_PROVIDER_SITE_OTHER): Payer: 59 | Admitting: Family Medicine

## 2021-10-29 ENCOUNTER — Encounter (INDEPENDENT_AMBULATORY_CARE_PROVIDER_SITE_OTHER): Payer: Self-pay | Admitting: Family Medicine

## 2021-10-29 VITALS — BP 120/64 | HR 60 | Temp 97.8°F | Ht 66.0 in | Wt 187.0 lb

## 2021-10-29 DIAGNOSIS — E669 Obesity, unspecified: Secondary | ICD-10-CM

## 2021-10-29 DIAGNOSIS — Z6830 Body mass index (BMI) 30.0-30.9, adult: Secondary | ICD-10-CM | POA: Diagnosis not present

## 2021-10-29 DIAGNOSIS — G47411 Narcolepsy with cataplexy: Secondary | ICD-10-CM

## 2021-10-29 DIAGNOSIS — E559 Vitamin D deficiency, unspecified: Secondary | ICD-10-CM

## 2021-11-01 NOTE — Progress Notes (Signed)
Chief Complaint:   OBESITY Felicia Sullivan is here to discuss her progress with her obesity treatment plan along with follow-up of her obesity related diagnoses. Felicia Sullivan is on the Category 2 Plan and states she is following her eating plan approximately 97% of the time. Felicia Sullivan states she is walking for 30 minutes 4 times per week.  Today's visit was #: 4 Starting weight: 200 lbs Starting date: 09/05/2021 Today's weight: 187 lbs Today's date: 10/29/21 Total lbs lost to date: 13 Total lbs lost since last in-office visit: -4  Interim History: She denies meal skipping.  She is doing well on meal plan.  She packs lunch for work.  Increased vegetables at dinner.  She is averaging 7500 steps per day.  She is walking 30 minutes at work 4 times per week.  She denies hunger or cravings.  Subjective:   1. Vitamin D deficiency Last vitamin D 27.4 on 09/05/2021. On prescription vitamin D weekly.  2. Transient total loss of muscle mass Reviewed bioimpedance showing loss of muscle mass.  Assessment/Plan:   1. Vitamin D deficiency Continue prescription vitamin D weekly.  2. Transient total loss of muscle mass Recommend 85 g of protein daily-increasing protein in snacks and adding in resistance training 10 minutes 3 times per week.  3. Obesity, current BMI 30.2 1.  Reviewed calorie counts by meal. 2.  Band It, band and handout given.  Felicia Sullivan is currently in the action stage of change. As such, her goal is to continue with weight loss efforts. She has agreed to the Category 2 Plan.   Exercise goals: Add bands 10 minutes 3 times per week.  Behavioral modification strategies: increasing lean protein intake, increasing vegetables, increasing water intake, no skipping meals, meal planning and cooking strategies, better snacking choices, planning for success, and decreasing junk food.  Felicia Sullivan has agreed to follow-up with our clinic in 2 weeks. She was informed of the importance of frequent follow-up visits to  maximize her success with intensive lifestyle modifications for her multiple health conditions.   Objective:   Blood pressure 120/64, pulse 60, temperature 97.8 F (36.6 C), height '5\' 6"'$  (1.676 m), weight 187 lb (84.8 kg), SpO2 98 %. Body mass index is 30.18 kg/m.  General: Cooperative, alert, well developed, in no acute distress. HEENT: Conjunctivae and lids unremarkable. Cardiovascular: Regular rhythm.  Lungs: Normal work of breathing. Neurologic: No focal deficits.   Lab Results  Component Value Date   CREATININE 0.73 09/05/2021   BUN 15 09/05/2021   NA 142 09/05/2021   K 4.8 09/05/2021   CL 103 09/05/2021   CO2 23 09/05/2021   Lab Results  Component Value Date   ALT 25 09/05/2021   AST 23 09/05/2021   ALKPHOS 94 09/05/2021   BILITOT 0.7 09/05/2021   Lab Results  Component Value Date   HGBA1C 5.5 09/05/2021   Lab Results  Component Value Date   INSULIN 14.9 09/05/2021   Lab Results  Component Value Date   TSH 0.844 09/05/2021   Lab Results  Component Value Date   CHOL 206 (H) 09/05/2021   HDL 85 09/05/2021   LDLCALC 110 (H) 09/05/2021   TRIG 58 09/05/2021   Lab Results  Component Value Date   VD25OH 27.4 (L) 09/05/2021   Lab Results  Component Value Date   WBC 6.2 05/29/2012   HGB 14.0 05/29/2012   HCT 39.5 05/29/2012   MCV 90.2 05/29/2012   PLT 286 05/29/2012   No results found for: "  IRON", "TIBC", "FERRITIN"   Attestation Statements:   Reviewed by clinician on day of visit: allergies, medications, problem list, medical history, surgical history, family history, social history, and previous encounter notes.  I, Georgianne Fick, FNP, am acting as transcriptionist for Dr. Loyal Gambler.  I have reviewed the above documentation for accuracy and completeness, and I agree with the above. Dell Ponto, DO

## 2021-11-06 ENCOUNTER — Other Ambulatory Visit (HOSPITAL_COMMUNITY): Payer: Self-pay

## 2021-11-06 ENCOUNTER — Telehealth: Payer: 59 | Admitting: Family Medicine

## 2021-11-06 DIAGNOSIS — U071 COVID-19: Secondary | ICD-10-CM | POA: Diagnosis not present

## 2021-11-06 DIAGNOSIS — R051 Acute cough: Secondary | ICD-10-CM

## 2021-11-06 MED ORDER — BENZONATATE 100 MG PO CAPS
100.0000 mg | ORAL_CAPSULE | Freq: Three times a day (TID) | ORAL | 0 refills | Status: DC | PRN
  Filled 2021-11-06: qty 30, 10d supply, fill #0

## 2021-11-06 MED ORDER — MOLNUPIRAVIR EUA 200MG CAPSULE
4.0000 | ORAL_CAPSULE | Freq: Two times a day (BID) | ORAL | 0 refills | Status: AC
  Filled 2021-11-06: qty 40, 5d supply, fill #0

## 2021-11-06 NOTE — Progress Notes (Signed)
Virtual Visit Consent   Felicia Sullivan, you are scheduled for a virtual visit with a [facility] provider today. Just as with appointments in the office, your consent must be obtained to participate. Your consent will be active for this visit and any virtual visit you may have with one of our providers in the next 365 days. If you have a MyChart account, a copy of this consent can be sent to you electronically.  As this is a virtual visit, video technology does not allow for your provider to perform a traditional examination. This may limit your provider's ability to fully assess your condition. If your provider identifies any concerns that need to be evaluated in person or the need to arrange testing (such as labs, EKG, etc.), we will make arrangements to do so. Although advances in technology are sophisticated, we cannot ensure that it will always work on either your end or our end. If the connection with a video visit is poor, the visit may have to be switched to a telephone visit. With either a video or telephone visit, we are not always able to ensure that we have a secure connection.  By engaging in this virtual visit, you consent to the provision of healthcare and authorize for your insurance to be billed (if applicable) for the services provided during this visit. Depending on your insurance coverage, you may receive a charge related to this service.  I need to obtain your verbal consent now. Are you willing to proceed with your visit today? Felicia Sullivan has provided verbal consent on 11/06/2021 for a virtual visit (video or telephone). Perlie Mayo, NP  Date: 11/06/2021 9:01 AM  Virtual Visit via Video Note   I, Perlie Mayo, connected with  Felicia Sullivan  ([id number], [birth year]) on 11/06/21 at  9:00 AM EDT by a video-enabled telemedicine application and verified that I am speaking with the correct person using two identifiers.  Location: Patient: Virtual Visit Location Patient:  Home Provider: Virtual Visit Location Provider: Home Office   I discussed the limitations of evaluation and management by telemedicine and the availability of in person appointments. The patient expressed understanding and agreed to proceed.    History of Present Illness: Felicia Sullivan is a 66 y.o. who identifies as a female who was assigned female at birth, and is being seen today for COVID + Onset of symptoms were Sunday evening- sore throat and congestion, last night developed chills and fever with t max of 101, headaches. Ear itching, mild cough. Husband was Positive last Wednesday.  Denies chest pain, shortness of breath  Problems:  Patient Active Problem List   Diagnosis Date Noted   Vitamin D insufficiency 09/19/2021   Elevated cholesterol 09/19/2021   Insulin resistance 09/19/2021   Other fatigue 09/05/2021   SOB (shortness of breath) on exertion 09/05/2021   Vitamin D deficiency 09/05/2021   Health care maintenance 09/05/2021   Elevated glucose 09/05/2021   Class 1 obesity with serious comorbidity and body mass index (BMI) of 32.0 to 32.9 in adult 09/05/2021    Allergies:  Allergies  Allergen Reactions   Aspirin Rash   Penicillins Rash   Tetracyclines & Related Rash   Medications:  Current Outpatient Medications:    ibuprofen (ADVIL,MOTRIN) 200 MG tablet, Take 3 tablets (600 mg total) by mouth every 6 (six) hours as needed for pain., Disp: 30 tablet, Rfl: 1   nitrofurantoin, macrocrystal-monohydrate, (MACROBID) 100 MG capsule, Take 1 capsule by mouth every  12 hours for 7 days., Disp: 14 capsule, Rfl: 1   nystatin cream (MYCOSTATIN), APPLY TO THE AFFECTED AREAS OF SKIN TWICE DAILY AS NEEDED, Disp: 30 g, Rfl: 2   pseudoephedrine (SUDAFED) 30 MG tablet, Take 30 mg by mouth every 4 (four) hours as needed for congestion., Disp: , Rfl:    triamcinolone cream (KENALOG) 0.1 %, APPLY A THIN LAYER TO THE AFFECTED AREAS OF SKIN TWICE DAILY AS NEEDED, Disp: 30 g, Rfl: 2   Vitamin D,  Ergocalciferol, (DRISDOL) 1.25 MG (50000 UNIT) CAPS capsule, Take 1 capsule (50,000 Units total) by mouth every 7 (seven) days., Disp: 5 capsule, Rfl: 0  Observations/Objective: Patient is well-developed, well-nourished in no acute distress.  Resting comfortably  at home.  Head is normocephalic, atraumatic.  No labored breathing.  Speech is clear and coherent with logical content.  Patient is alert and oriented at baseline.  Congestion nasal tone Cough- mild-dry  Assessment and Plan:   1. COVID-19  - molnupiravir EUA (LAGEVRIO) 200 mg CAPS capsule; Take 4 capsules (800 mg total) by mouth 2 (two) times daily for 5 days.  Dispense: 40 capsule; Refill: 0 - benzonatate (TESSALON) 100 MG capsule; Take 1 capsule (100 mg total) by mouth 3 (three) times daily as needed for cough.  Dispense: 30 capsule; Refill: 0  2. Acute cough  - benzonatate (TESSALON) 100 MG capsule; Take 1 capsule (100 mg total) by mouth 3 (three) times daily as needed for cough.  Dispense: 30 capsule; Refill: 0   -rest -hydrate -OTC reviewed and on AVS with covid info -antiviral provided-given age and weight, but overall symptoms are mild to moderate -no red flags noted during visit   Reviewed side effects, risks and benefits of medication.    Patient acknowledged agreement and understanding of the plan.   Past Medical, Surgical, Social History, Allergies, and Medications have been Reviewed.    Follow Up Instructions: I discussed the assessment and treatment plan with the patient. The patient was provided an opportunity to ask questions and all were answered. The patient agreed with the plan and demonstrated an understanding of the instructions.  A copy of instructions were sent to the patient via MyChart unless otherwise noted below.   The patient was advised to call back or seek an in-person evaluation if the symptoms worsen or if the condition fails to improve as anticipated.  Time:  I spent 10 minutes  with the patient via telehealth technology discussing the above problems/concerns.    Perlie Mayo, NP

## 2021-11-06 NOTE — Patient Instructions (Signed)
Jesusita Oka, thank you for joining Perlie Mayo, NP for today's virtual visit.  While this provider is not your primary care provider (PCP), if your PCP is located in our provider database this encounter information will be shared with them immediately following your visit.  Consent: (Patient) Felicia Sullivan provided verbal consent for this virtual visit at the beginning of the encounter.  Current Medications:  Current Outpatient Medications:    benzonatate (TESSALON) 100 MG capsule, Take 1 capsule (100 mg total) by mouth 3 (three) times daily as needed for cough., Disp: 30 capsule, Rfl: 0   molnupiravir EUA (LAGEVRIO) 200 mg CAPS capsule, Take 4 capsules (800 mg total) by mouth 2 (two) times daily for 5 days., Disp: 40 capsule, Rfl: 0   ibuprofen (ADVIL,MOTRIN) 200 MG tablet, Take 3 tablets (600 mg total) by mouth every 6 (six) hours as needed for pain., Disp: 30 tablet, Rfl: 1   nitrofurantoin, macrocrystal-monohydrate, (MACROBID) 100 MG capsule, Take 1 capsule by mouth every 12 hours for 7 days., Disp: 14 capsule, Rfl: 1   nystatin cream (MYCOSTATIN), APPLY TO THE AFFECTED AREAS OF SKIN TWICE DAILY AS NEEDED, Disp: 30 g, Rfl: 2   pseudoephedrine (SUDAFED) 30 MG tablet, Take 30 mg by mouth every 4 (four) hours as needed for congestion., Disp: , Rfl:    triamcinolone cream (KENALOG) 0.1 %, APPLY A THIN LAYER TO THE AFFECTED AREAS OF SKIN TWICE DAILY AS NEEDED, Disp: 30 g, Rfl: 2   Vitamin D, Ergocalciferol, (DRISDOL) 1.25 MG (50000 UNIT) CAPS capsule, Take 1 capsule (50,000 Units total) by mouth every 7 (seven) days., Disp: 5 capsule, Rfl: 0   Medications ordered in this encounter:  Meds ordered this encounter  Medications   molnupiravir EUA (LAGEVRIO) 200 mg CAPS capsule    Sig: Take 4 capsules (800 mg total) by mouth 2 (two) times daily for 5 days.    Dispense:  40 capsule    Refill:  0    Order Specific Question:   Supervising Provider    Answer:   Chase Picket [[id number]]    benzonatate (TESSALON) 100 MG capsule    Sig: Take 1 capsule (100 mg total) by mouth 3 (three) times daily as needed for cough.    Dispense:  30 capsule    Refill:  0    Order Specific Question:   Supervising Provider    Answer:   Chase Picket [id number]     *If you need refills on other medications prior to your next appointment, please contact your pharmacy*  Follow-Up: Call back or seek an in-person evaluation if the symptoms worsen or if the condition fails to improve as anticipated.  [facility] [phone]  Other Instructions Please keep well-hydrated and get plenty of rest. Start a saline nasal rinse to flush out your nasal passages. You can use plain Mucinex to help thin congestion. If you have a humidifier, running in the bedroom at night. I want you to start OTC vitamin D3 1000 units daily, vitamin C 1000 mg daily, and a zinc supplement. Please take prescribed medications as directed.  You have been enrolled in a MyChart symptom monitoring program. Please answer these questions daily so we can keep track of how you are doing.  You were to quarantine for 5 days from onset of your symptoms.  After day 5, if you have had no fever and you are feeling better, you can end quarantine but need to mask for an  additional 5 days. After day 5 if you have a fever or are having significant symptoms, please quarantine for full 10 days.  If you note any worsening of symptoms, any significant shortness of breath or any chest pain, please seek ER evaluation ASAP.  Please do not delay care!  COVID-19: What to Do if You Are Sick If you test positive and are an older adult or someone who is at high risk of getting very sick from COVID-19, treatment may be available. Contact a healthcare provider right away after a positive test to determine if you are eligible, even if your symptoms are mild right now. You can also visit a Test to Treat location and, if eligible, receive a  prescription from a provider. Don't delay: Treatment must be started within the first few days to be effective. If you have a fever, cough, or other symptoms, you might have COVID-19. Most people have mild illness and are able to recover at home. If you are sick: Keep track of your symptoms. If you have an emergency warning sign (including trouble breathing), call 911. Steps to help prevent the spread of COVID-19 if you are sick If you are sick with COVID-19 or think you might have COVID-19, follow the steps below to care for yourself and to help protect other people in your home and community. Stay home except to get medical care Stay home. Most people with COVID-19 have mild illness and can recover at home without medical care. Do not leave your home, except to get medical care. Do not visit public areas and do not go to places where you are unable to wear a mask. Take care of yourself. Get rest and stay hydrated. Take over-the-counter medicines, such as acetaminophen, to help you feel better. Stay in touch with your doctor. Call before you get medical care. Be sure to get care if you have trouble breathing, or have any other emergency warning signs, or if you think it is an emergency. Avoid public transportation, ride-sharing, or taxis if possible. Get tested If you have symptoms of COVID-19, get tested. While waiting for test results, stay away from others, including staying apart from those living in your household. Get tested as soon as possible after your symptoms start. Treatments may be available for people with COVID-19 who are at risk for becoming very sick. Don't delay: Treatment must be started early to be effective--some treatments must begin within 5 days of your first symptoms. Contact your healthcare provider right away if your test result is positive to determine if you are eligible. Self-tests are one of several options for testing for the virus that causes COVID-19 and may be more  convenient than laboratory-based tests and point-of-care tests. Ask your healthcare provider or your local health department if you need help interpreting your test results. You can visit your state, tribal, local, and territorial health department's website to look for the latest local information on testing sites. Separate yourself from other people As much as possible, stay in a specific room and away from other people and pets in your home. If possible, you should use a separate bathroom. If you need to be around other people or animals in or outside of the home, wear a well-fitting mask. Tell your close contacts that they may have been exposed to COVID-19. An infected person can spread COVID-19 starting 48 hours (or 2 days) before the person has any symptoms or tests positive. By letting your close contacts know they may have  been exposed to COVID-19, you are helping to protect everyone. See COVID-19 and Animals if you have questions about pets. If you are diagnosed with COVID-19, someone from the health department may call you. Answer the call to slow the spread. Monitor your symptoms Symptoms of COVID-19 include fever, cough, or other symptoms. Follow care instructions from your healthcare provider and local health department. Your local health authorities may give instructions on checking your symptoms and reporting information. When to seek emergency medical attention Look for emergency warning signs* for COVID-19. If someone is showing any of these signs, seek emergency medical care immediately: Trouble breathing Persistent pain or pressure in the chest New confusion Inability to wake or stay awake Pale, gray, or blue-colored skin, lips, or nail beds, depending on skin tone *This list is not all possible symptoms. Please call your medical provider for any other symptoms that are severe or concerning to you. Call 911 or call ahead to your local emergency facility: Notify the operator that  you are seeking care for someone who has or may have COVID-19. Call ahead before visiting your doctor Call ahead. Many medical visits for routine care are being postponed or done by phone or telemedicine. If you have a medical appointment that cannot be postponed, call your doctor's office, and tell them you have or may have COVID-19. This will help the office protect themselves and other patients. If you are sick, wear a well-fitting mask You should wear a mask if you must be around other people or animals, including pets (even at home). Wear a mask with the best fit, protection, and comfort for you. You don't need to wear the mask if you are alone. If you can't put on a mask (because of trouble breathing, for example), cover your coughs and sneezes in some other way. Try to stay at least 6 feet away from other people. This will help protect the people around you. Masks should not be placed on young children under age 17 years, anyone who has trouble breathing, or anyone who is not able to remove the mask without help. Cover your coughs and sneezes Cover your mouth and nose with a tissue when you cough or sneeze. Throw away used tissues in a lined trash can. Immediately wash your hands with soap and water for at least 20 seconds. If soap and water are not available, clean your hands with an alcohol-based hand sanitizer that contains at least 60% alcohol. Clean your hands often Wash your hands often with soap and water for at least 20 seconds. This is especially important after blowing your nose, coughing, or sneezing; going to the bathroom; and before eating or preparing food. Use hand sanitizer if soap and water are not available. Use an alcohol-based hand sanitizer with at least 60% alcohol, covering all surfaces of your hands and rubbing them together until they feel dry. Soap and water are the best option, especially if hands are visibly dirty. Avoid touching your eyes, nose, and mouth with  unwashed hands. Handwashing Tips Avoid sharing personal household items Do not share dishes, drinking glasses, cups, eating utensils, towels, or bedding with other people in your home. Wash these items thoroughly after using them with soap and water or put in the dishwasher. Clean surfaces in your home regularly Clean and disinfect high-touch surfaces (for example, doorknobs, tables, handles, light switches, and countertops) in your "sick room" and bathroom. In shared spaces, you should clean and disinfect surfaces and items after each use by the  person who is ill. If you are sick and cannot clean, a caregiver or other person should only clean and disinfect the area around you (such as your bedroom and bathroom) on an as needed basis. Your caregiver/other person should wait as long as possible (at least several hours) and wear a mask before entering, cleaning, and disinfecting shared spaces that you use. Clean and disinfect areas that may have blood, stool, or body fluids on them. Use household cleaners and disinfectants. Clean visible dirty surfaces with household cleaners containing soap or detergent. Then, use a household disinfectant. Use a product from [employer] List N: Disinfectants for Coronavirus (FWYOV-78). Be sure to follow the instructions on the label to ensure safe and effective use of the product. Many products recommend keeping the surface wet with a disinfectant for a certain period of time (look at "contact time" on the product label). You may also need to wear personal protective equipment, such as gloves, depending on the directions on the product label. Immediately after disinfecting, wash your hands with soap and water for 20 seconds. For completed guidance on cleaning and disinfecting your home, visit Complete Disinfection Guidance. Take steps to improve ventilation at home Improve ventilation (air flow) at home to help prevent from spreading COVID-19 to other people in your  household. Clear out COVID-19 virus particles in the air by opening windows, using air filters, and turning on fans in your home. Use this interactive tool to learn how to improve air flow in your home. When you can be around others after being sick with COVID-19 Deciding when you can be around others is different for different situations. Find out when you can safely end home isolation. For any additional questions about your care, contact your healthcare provider or state or local health department. 04/18/2020 Content source: [facility] for Immunization and Respiratory Diseases ([facility]), Division of Viral Diseases This information is not intended to replace advice given to you by your health care provider. Make sure you discuss any questions you have with your health care provider. Document Revised: 06/01/2020 Document Reviewed: 06/01/2020 Elsevier Patient Education  2022 [employer].      If you have been instructed to have an in-person evaluation today at a local Urgent Care facility, please use the link below. It will take you to a list of all of our available [facility], including address, phone number and hours of operation. Please do not delay care.  [facility]  If you or a family member do not have a primary care provider, use the link below to schedule a visit and establish care. When you choose a [facility] primary care physician or advanced practice provider, you gain a long-term partner in health. Find a Primary Care Provider  Learn more about [facility] in-office and virtual care options: [facility]

## 2021-11-07 ENCOUNTER — Other Ambulatory Visit (HOSPITAL_COMMUNITY): Payer: Self-pay

## 2021-11-12 ENCOUNTER — Other Ambulatory Visit (HOSPITAL_COMMUNITY): Payer: Self-pay

## 2021-11-12 ENCOUNTER — Encounter (INDEPENDENT_AMBULATORY_CARE_PROVIDER_SITE_OTHER): Payer: Self-pay | Admitting: Family Medicine

## 2021-11-12 ENCOUNTER — Ambulatory Visit (INDEPENDENT_AMBULATORY_CARE_PROVIDER_SITE_OTHER): Payer: 59 | Admitting: Family Medicine

## 2021-11-12 VITALS — BP 113/60 | HR 62 | Temp 98.0°F | Ht 66.0 in | Wt 183.0 lb

## 2021-11-12 DIAGNOSIS — Z6829 Body mass index (BMI) 29.0-29.9, adult: Secondary | ICD-10-CM | POA: Diagnosis not present

## 2021-11-12 DIAGNOSIS — E669 Obesity, unspecified: Secondary | ICD-10-CM | POA: Diagnosis not present

## 2021-11-12 DIAGNOSIS — G47411 Narcolepsy with cataplexy: Secondary | ICD-10-CM | POA: Diagnosis not present

## 2021-11-12 DIAGNOSIS — E559 Vitamin D deficiency, unspecified: Secondary | ICD-10-CM

## 2021-11-12 MED ORDER — VITAMIN D (ERGOCALCIFEROL) 1.25 MG (50000 UNIT) PO CAPS
50000.0000 [IU] | ORAL_CAPSULE | ORAL | 0 refills | Status: DC
  Filled 2021-11-12: qty 5, 35d supply, fill #0

## 2021-11-20 NOTE — Progress Notes (Unsigned)
Chief Complaint:   OBESITY Felicia Sullivan is here to discuss her progress with her obesity treatment plan along with follow-up of her obesity related diagnoses. Felicia Sullivan is on the Category 2 Plan and states she is following her eating plan approximately 90% of the time. Felicia Sullivan states she is walking 30 minutes 4-5 times per week.  Today's visit was #: 5 Starting weight: 200 lbs Starting date: 09/05/2021 Today's weight: 183 lbs Today's date: 11/12/2021 Total lbs lost to date: 17 lbs Total lbs lost since last in-office visit: 4 lbs  Interim History: She had covid last week.  She is feeling better this week.  She had some chicken soup.  She has a resistance band for home use.  She has been walking indoors at work for 30 minutes 4-5 days per week.  She is doing great with her meal plan.  She struggles to get in enough water.   Subjective:   1. Vitamin D deficiency She is currently taking prescription vitamin D 50,000 IU each week. She denies nausea, vomiting or muscle weakness.  2. Transient total loss of muscle tone Muscle mass is stable this visit with increased protein intake with a target of 80 grams per day.  Assessment/Plan:   1. Vitamin D deficiency Refill - Vitamin D, Ergocalciferol, (DRISDOL) 1.25 MG (50000 UNIT) CAPS capsule; Take 1 capsule (50,000 Units total) by mouth every 7 (seven) days.  Dispense: 5 capsule; Refill: 0  2. Transient total loss of muscle tone Agrees to yoga or bands one time per week.   3. Obesity,current BMI 29.5 Felicia Sullivan is currently in the action stage of change. As such, her goal is to continue with weight loss efforts. She has agreed to the Category 2 Plan.   Exercise goals:  thinking about yoga  Behavioral modification strategies: increasing lean protein intake, increasing vegetables, increasing water intake, decreasing eating out, no skipping meals, meal planning and cooking strategies, keeping healthy foods in the home, and decreasing junk food.  Felicia Sullivan has  agreed to follow-up with our clinic in 2 weeks. She was informed of the importance of frequent follow-up visits to maximize her success with intensive lifestyle modifications for her multiple health conditions.   Objective:   Blood pressure 113/60, pulse 62, temperature 98 F (36.7 C), height '5\' 6"'$  (1.676 m), weight 183 lb (83 kg), SpO2 96 %. Body mass index is 29.54 kg/m.  General: Cooperative, alert, well developed, in no acute distress. HEENT: Conjunctivae and lids unremarkable. Cardiovascular: Regular rhythm.  Lungs: Normal work of breathing. Neurologic: No focal deficits.   Lab Results  Component Value Date   CREATININE 0.73 09/05/2021   BUN 15 09/05/2021   NA 142 09/05/2021   K 4.8 09/05/2021   CL 103 09/05/2021   CO2 23 09/05/2021   Lab Results  Component Value Date   ALT 25 09/05/2021   AST 23 09/05/2021   ALKPHOS 94 09/05/2021   BILITOT 0.7 09/05/2021   Lab Results  Component Value Date   HGBA1C 5.5 09/05/2021   Lab Results  Component Value Date   INSULIN 14.9 09/05/2021   Lab Results  Component Value Date   TSH 0.844 09/05/2021   Lab Results  Component Value Date   CHOL 206 (H) 09/05/2021   HDL 85 09/05/2021   LDLCALC 110 (H) 09/05/2021   TRIG 58 09/05/2021   Lab Results  Component Value Date   VD25OH 27.4 (L) 09/05/2021   Lab Results  Component Value Date   WBC 6.2  05/29/2012   HGB 14.0 05/29/2012   HCT 39.5 05/29/2012   MCV 90.2 05/29/2012   PLT 286 05/29/2012   No results found for: "IRON", "TIBC", "FERRITIN"  Attestation Statements:   Reviewed by clinician on day of visit: allergies, medications, problem list, medical history, surgical history, family history, social history, and previous encounter notes.  I, Davy Pique, am acting as Location manager for Loyal Gambler, DO.  I have reviewed the above documentation for accuracy and completeness, and I agree with the above. Dell Ponto, DO

## 2021-11-29 ENCOUNTER — Encounter (INDEPENDENT_AMBULATORY_CARE_PROVIDER_SITE_OTHER): Payer: Self-pay | Admitting: Family Medicine

## 2021-11-29 ENCOUNTER — Ambulatory Visit (INDEPENDENT_AMBULATORY_CARE_PROVIDER_SITE_OTHER): Payer: 59 | Admitting: Family Medicine

## 2021-11-29 ENCOUNTER — Other Ambulatory Visit (HOSPITAL_COMMUNITY): Payer: Self-pay

## 2021-11-29 VITALS — BP 120/61 | HR 61 | Temp 97.8°F | Ht 66.0 in | Wt 181.0 lb

## 2021-11-29 DIAGNOSIS — E669 Obesity, unspecified: Secondary | ICD-10-CM | POA: Diagnosis not present

## 2021-11-29 DIAGNOSIS — Z6829 Body mass index (BMI) 29.0-29.9, adult: Secondary | ICD-10-CM

## 2021-11-29 DIAGNOSIS — E559 Vitamin D deficiency, unspecified: Secondary | ICD-10-CM | POA: Diagnosis not present

## 2021-11-29 MED ORDER — VITAMIN D (ERGOCALCIFEROL) 1.25 MG (50000 UNIT) PO CAPS
50000.0000 [IU] | ORAL_CAPSULE | ORAL | 0 refills | Status: DC
  Filled 2021-11-29: qty 5, 35d supply, fill #0

## 2021-12-12 NOTE — Progress Notes (Unsigned)
Chief Complaint:   OBESITY Felicia Sullivan is here to discuss her progress with her obesity treatment plan along with follow-up of her obesity related diagnoses. Felicia Sullivan is on the Category 2 Plan and states she is following her eating plan approximately 97% of the time. Felicia Sullivan states she is walking and resistance bands 3 minutes 3-4 times per week.  Today's visit was #: 6 Starting weight: 200 lbs Starting date: 09/05/2021 Today's weight: 181 lbs Today's date: 11/29/2021 Total lbs lost to date: 19 lbs Total lbs lost since last in-office visit: 2 lbs  Interim History: Has some sweet cravings but is keeping junk food to a minimum.  Saves 2 snacks for after dinner but stays on plan with calories.  She denies meal skipping.  She is doing some walking and bands.  Plans to try yoga and bike riding.   Subjective:   1. Vitamin D deficiency She is currently taking prescription vitamin D 50,000 IU each week. She denies nausea, vomiting or muscle weakness. Last Vitamin D level 27.4.    Assessment/Plan:   1. Vitamin D deficiency Recheck level in December.   Refill - Vitamin D, Ergocalciferol, (DRISDOL) 1.25 MG (50000 UNIT) CAPS capsule; Take 1 capsule by mouth every 7 days.  Dispense: 5 capsule; Refill: 0  2. Obesity,current BMI 29.3 1) Reviewed progress 9.5 % TBW loss in 3 months.  BMI now <30.  2) Try out yoga before next visit.   Felicia Sullivan is currently in the action stage of change. As such, her goal is to continue with weight loss efforts. She has agreed to the Category 2 Plan.   Exercise goals: All adults should avoid inactivity. Some physical activity is better than none, and adults who participate in any amount of physical activity gain some health benefits.  Behavioral modification strategies: increasing lean protein intake, increasing vegetables, increasing water intake, no skipping meals, meal planning and cooking strategies, keeping healthy foods in the home, and decreasing junk food.  Felicia Sullivan has  agreed to follow-up with our clinic in 3 weeks. She was informed of the importance of frequent follow-up visits to maximize her success with intensive lifestyle modifications for her multiple health conditions.   Objective:   Blood pressure 120/61, pulse 61, temperature 97.8 F (36.6 C), height '5\' 6"'$  (1.676 m), weight 181 lb (82.1 kg), SpO2 95 %. Body mass index is 29.21 kg/m.  General: Cooperative, alert, well developed, in no acute distress. HEENT: Conjunctivae and lids unremarkable. Cardiovascular: Regular rhythm.  Lungs: Normal work of breathing. Neurologic: No focal deficits.   Lab Results  Component Value Date   CREATININE 0.73 09/05/2021   BUN 15 09/05/2021   NA 142 09/05/2021   K 4.8 09/05/2021   CL 103 09/05/2021   CO2 23 09/05/2021   Lab Results  Component Value Date   ALT 25 09/05/2021   AST 23 09/05/2021   ALKPHOS 94 09/05/2021   BILITOT 0.7 09/05/2021   Lab Results  Component Value Date   HGBA1C 5.5 09/05/2021   Lab Results  Component Value Date   INSULIN 14.9 09/05/2021   Lab Results  Component Value Date   TSH 0.844 09/05/2021   Lab Results  Component Value Date   CHOL 206 (H) 09/05/2021   HDL 85 09/05/2021   LDLCALC 110 (H) 09/05/2021   TRIG 58 09/05/2021   Lab Results  Component Value Date   VD25OH 27.4 (L) 09/05/2021   Lab Results  Component Value Date   WBC 6.2 05/29/2012  HGB 14.0 05/29/2012   HCT 39.5 05/29/2012   MCV 90.2 05/29/2012   PLT 286 05/29/2012   No results found for: "IRON", "TIBC", "FERRITIN"  Attestation Statements:   Reviewed by clinician on day of visit: allergies, medications, problem list, medical history, surgical history, family history, social history, and previous encounter notes.  I, Davy Pique, am acting as Location manager for Loyal Gambler, DO.  I have reviewed the above documentation for accuracy and completeness, and I agree with the above. Dell Ponto, DO

## 2021-12-17 ENCOUNTER — Other Ambulatory Visit (HOSPITAL_COMMUNITY): Payer: Self-pay

## 2021-12-17 ENCOUNTER — Encounter (INDEPENDENT_AMBULATORY_CARE_PROVIDER_SITE_OTHER): Payer: Self-pay | Admitting: Family Medicine

## 2021-12-17 ENCOUNTER — Ambulatory Visit (INDEPENDENT_AMBULATORY_CARE_PROVIDER_SITE_OTHER): Payer: 59 | Admitting: Family Medicine

## 2021-12-17 VITALS — BP 122/80 | HR 70 | Temp 97.9°F | Ht 66.0 in | Wt 177.0 lb

## 2021-12-17 DIAGNOSIS — E669 Obesity, unspecified: Secondary | ICD-10-CM

## 2021-12-17 DIAGNOSIS — Z6828 Body mass index (BMI) 28.0-28.9, adult: Secondary | ICD-10-CM

## 2021-12-17 DIAGNOSIS — E559 Vitamin D deficiency, unspecified: Secondary | ICD-10-CM | POA: Diagnosis not present

## 2021-12-17 MED ORDER — VITAMIN D (ERGOCALCIFEROL) 1.25 MG (50000 UNIT) PO CAPS
50000.0000 [IU] | ORAL_CAPSULE | ORAL | 0 refills | Status: DC
  Filled 2021-12-17: qty 5, 35d supply, fill #0

## 2021-12-31 NOTE — Progress Notes (Unsigned)
Chief Complaint:   OBESITY Felicia Sullivan is here to discuss her progress with her obesity treatment plan along with follow-up of her obesity related diagnoses. Felicia Sullivan is on the Category 2 Plan and states she is following her eating plan approximately 95% of the time. Felicia Sullivan states she is walking 30 minutes 3-4 times per week.  Today's visit was #: 7 Starting weight: 200 lbs Starting date: 09/05/2021 Today's weight: 177 lbs Today's date: 12/17/2021 Total lbs lost to date: 23 lbs Total lbs lost since last in-office visit: 4 lbs  Interim History: Keeping 30 minutes walk at work 3-4 times per week.  Has seen loss of inches started use of bands.  Sticking to meal plan.  Had to travel, found it harder to stay on track.  Denies hunger or cravings.   Subjective:   1. Vitamin D deficiency She is currently taking prescription vitamin D 50,000 IU each week. She denies nausea, vomiting or muscle weakness. Last Vitamin D level on 09/05/21 was 27.4.  Assessment/Plan:   1. Vitamin D deficiency Recheck Vitamin D level in January.  Refill - Vitamin D, Ergocalciferol, (DRISDOL) 1.25 MG (50000 UNIT) CAPS capsule; Take 1 capsule by mouth every 7 days.  Dispense: 5 capsule; Refill: 0  2. Obesity,current BMI 28.6 1) Resistance training 15 minutes 2 times per week.  2) Increased water intake to >64 oz per day.   Felicia Sullivan is currently in the action stage of change. As such, her goal is to continue with weight loss efforts. She has agreed to the Category 2 Plan.   Exercise goals:  As is.   Behavioral modification strategies: increasing lean protein intake, increasing vegetables, increasing water intake, decreasing eating out, no skipping meals, meal planning and cooking strategies, avoiding temptations, and decreasing junk food.  Felicia Sullivan has agreed to follow-up with our clinic in 4 weeks. She was informed of the importance of frequent follow-up visits to maximize her success with intensive lifestyle modifications  for her multiple health conditions.   Objective:   Blood pressure 122/80, pulse 70, temperature 97.9 F (36.6 C), height '5\' 6"'$  (1.676 m), weight 177 lb (80.3 kg), SpO2 95 %. Body mass index is 28.57 kg/m.  General: Cooperative, alert, well developed, in no acute distress. HEENT: Conjunctivae and lids unremarkable. Cardiovascular: Regular rhythm.  Lungs: Normal work of breathing. Neurologic: No focal deficits.   Lab Results  Component Value Date   CREATININE 0.73 09/05/2021   BUN 15 09/05/2021   NA 142 09/05/2021   K 4.8 09/05/2021   CL 103 09/05/2021   CO2 23 09/05/2021   Lab Results  Component Value Date   ALT 25 09/05/2021   AST 23 09/05/2021   ALKPHOS 94 09/05/2021   BILITOT 0.7 09/05/2021   Lab Results  Component Value Date   HGBA1C 5.5 09/05/2021   Lab Results  Component Value Date   INSULIN 14.9 09/05/2021   Lab Results  Component Value Date   TSH 0.844 09/05/2021   Lab Results  Component Value Date   CHOL 206 (H) 09/05/2021   HDL 85 09/05/2021   LDLCALC 110 (H) 09/05/2021   TRIG 58 09/05/2021   Lab Results  Component Value Date   VD25OH 27.4 (L) 09/05/2021   Lab Results  Component Value Date   WBC 6.2 05/29/2012   HGB 14.0 05/29/2012   HCT 39.5 05/29/2012   MCV 90.2 05/29/2012   PLT 286 05/29/2012   No results found for: "IRON", "TIBC", "FERRITIN"  Attestation Statements:  Reviewed by clinician on day of visit: allergies, medications, problem list, medical history, surgical history, family history, social history, and previous encounter notes.  I, Davy Pique, am acting as Location manager for Loyal Gambler, DO.  I have reviewed the above documentation for accuracy and completeness, and I agree with the above. Dell Ponto, DO

## 2022-01-07 ENCOUNTER — Other Ambulatory Visit (HOSPITAL_COMMUNITY): Payer: Self-pay

## 2022-01-07 ENCOUNTER — Ambulatory Visit (INDEPENDENT_AMBULATORY_CARE_PROVIDER_SITE_OTHER): Payer: 59 | Admitting: Family Medicine

## 2022-01-07 ENCOUNTER — Encounter (INDEPENDENT_AMBULATORY_CARE_PROVIDER_SITE_OTHER): Payer: Self-pay | Admitting: Family Medicine

## 2022-01-07 VITALS — BP 122/78 | HR 65 | Temp 97.7°F | Ht 66.0 in | Wt 174.0 lb

## 2022-01-07 DIAGNOSIS — E669 Obesity, unspecified: Secondary | ICD-10-CM

## 2022-01-07 DIAGNOSIS — Z6828 Body mass index (BMI) 28.0-28.9, adult: Secondary | ICD-10-CM

## 2022-01-07 DIAGNOSIS — E559 Vitamin D deficiency, unspecified: Secondary | ICD-10-CM | POA: Diagnosis not present

## 2022-01-07 MED ORDER — VITAMIN D (ERGOCALCIFEROL) 1.25 MG (50000 UNIT) PO CAPS
50000.0000 [IU] | ORAL_CAPSULE | ORAL | 0 refills | Status: DC
  Filled 2022-01-07 (×2): qty 5, 35d supply, fill #0

## 2022-01-08 ENCOUNTER — Other Ambulatory Visit (HOSPITAL_COMMUNITY): Payer: Self-pay

## 2022-01-09 DIAGNOSIS — H5213 Myopia, bilateral: Secondary | ICD-10-CM | POA: Diagnosis not present

## 2022-01-09 DIAGNOSIS — H524 Presbyopia: Secondary | ICD-10-CM | POA: Diagnosis not present

## 2022-01-10 ENCOUNTER — Other Ambulatory Visit (HOSPITAL_COMMUNITY): Payer: Self-pay

## 2022-01-10 MED ORDER — CEFDINIR 300 MG PO CAPS
ORAL_CAPSULE | ORAL | 0 refills | Status: DC
  Filled 2022-01-10: qty 14, 7d supply, fill #0

## 2022-01-14 NOTE — Progress Notes (Signed)
Chief Complaint:   OBESITY Felicia Sullivan is here to discuss her progress with her obesity treatment plan along with follow-up of her obesity related diagnoses. Felicia Sullivan is on the Category 2 Plan and states she is following her eating plan approximately 90% of the time. Felicia Sullivan states she is walking and resistance bands 30 minutes 3-4 times per week.  Today's visit was #: 8 Starting weight: 200 LBS Starting date: 09/05/2021 Today's weight: 174 LBS Today's date: 01/07/2022 Total lbs lost to date: 26 LBS Total lbs lost since last in-office visit: 3 LBS  Interim History: Patient has been sticking to meal plan.  She traveled to [place] and was able to turn down sweets.  Hunger and cravings well-controlled.  Her daughter lives in [place] and is coming home for Christmas.  She is happy with her progress so far.  Subjective:   1. Vitamin D deficiency Patient's last vitamin D level was 27.4 on 09/05/2021.  Patient is due for a vitamin D level next visit.  Patient's energy level is improving.  Goal greater than 50.  Assessment/Plan:   1. Vitamin D deficiency Refill- Vitamin D, Ergocalciferol, (DRISDOL) 1.25 MG (50000 UNIT) CAPS capsule; Take 1 capsule by mouth every 7 days.  Dispense: 5 capsule; Refill: 0  2. Obesity, current BMI 28.2 1.  Reviewed holiday plan (keeping with sleep at home). 2.  Discussed plan for staying on track with holiday gatherings.  Felicia Sullivan is currently in the action stage of change. As such, her goal is to continue with weight loss efforts. She has agreed to the Category 2 Plan.   Exercise goals:  As is.  Behavioral modification strategies: increasing lean protein intake, increasing vegetables, increasing water intake, decreasing eating out, no skipping meals, meal planning and cooking strategies, keeping healthy foods in the home, and planning for success.  Felicia Sullivan has agreed to follow-up with our clinic in 4 weeks. She was informed of the importance of frequent follow-up  visits to maximize her success with intensive lifestyle modifications for her multiple health conditions.   Objective:   Blood pressure 122/78, pulse 65, temperature 97.7 F (36.5 C), height '5\' 6"'$  (1.676 m), weight 174 lb (78.9 kg), SpO2 98 %. Body mass index is 28.08 kg/m.  General: Cooperative, alert, well developed, in no acute distress. HEENT: Conjunctivae and lids unremarkable. Cardiovascular: Regular rhythm.  Lungs: Normal work of breathing. Neurologic: No focal deficits.   Lab Results  Component Value Date   CREATININE 0.73 09/05/2021   BUN 15 09/05/2021   NA 142 09/05/2021   K 4.8 09/05/2021   CL 103 09/05/2021   CO2 23 09/05/2021   Lab Results  Component Value Date   ALT 25 09/05/2021   AST 23 09/05/2021   ALKPHOS 94 09/05/2021   BILITOT 0.7 09/05/2021   Lab Results  Component Value Date   HGBA1C 5.5 09/05/2021   Lab Results  Component Value Date   INSULIN 14.9 09/05/2021   Lab Results  Component Value Date   TSH 0.844 09/05/2021   Lab Results  Component Value Date   CHOL 206 (H) 09/05/2021   HDL 85 09/05/2021   LDLCALC 110 (H) 09/05/2021   TRIG 58 09/05/2021   Lab Results  Component Value Date   VD25OH 27.4 (L) 09/05/2021   Lab Results  Component Value Date   WBC 6.2 05/29/2012   HGB 14.0 05/29/2012   HCT 39.5 05/29/2012   MCV 90.2 05/29/2012   PLT 286 05/29/2012   No results found  for: "IRON", "TIBC", "FERRITIN"  Attestation Statements:   Reviewed by clinician on day of visit: allergies, medications, problem list, medical history, surgical history, family history, social history, and previous encounter notes.  I, Davy Pique, am acting as Location manager for Loyal Gambler, DO.  I have reviewed the above documentation for accuracy and completeness, and I agree with the above. Dell Ponto, DO

## 2022-01-31 ENCOUNTER — Other Ambulatory Visit (HOSPITAL_BASED_OUTPATIENT_CLINIC_OR_DEPARTMENT_OTHER): Payer: Self-pay

## 2022-01-31 MED ORDER — COMIRNATY 30 MCG/0.3ML IM SUSY
PREFILLED_SYRINGE | INTRAMUSCULAR | 0 refills | Status: DC
  Filled 2022-01-31: qty 0.3, 1d supply, fill #0

## 2022-02-04 ENCOUNTER — Ambulatory Visit (INDEPENDENT_AMBULATORY_CARE_PROVIDER_SITE_OTHER): Payer: Commercial Managed Care - PPO | Admitting: Family Medicine

## 2022-02-04 ENCOUNTER — Encounter (INDEPENDENT_AMBULATORY_CARE_PROVIDER_SITE_OTHER): Payer: Self-pay | Admitting: Family Medicine

## 2022-02-04 ENCOUNTER — Other Ambulatory Visit (HOSPITAL_COMMUNITY): Payer: Self-pay

## 2022-02-04 VITALS — BP 129/75 | HR 52 | Temp 97.5°F | Ht 66.0 in | Wt 171.0 lb

## 2022-02-04 DIAGNOSIS — Z6827 Body mass index (BMI) 27.0-27.9, adult: Secondary | ICD-10-CM | POA: Diagnosis not present

## 2022-02-04 DIAGNOSIS — E669 Obesity, unspecified: Secondary | ICD-10-CM | POA: Diagnosis not present

## 2022-02-04 DIAGNOSIS — E559 Vitamin D deficiency, unspecified: Secondary | ICD-10-CM

## 2022-02-04 DIAGNOSIS — G47411 Narcolepsy with cataplexy: Secondary | ICD-10-CM | POA: Diagnosis not present

## 2022-02-04 MED ORDER — VITAMIN D (ERGOCALCIFEROL) 1.25 MG (50000 UNIT) PO CAPS
50000.0000 [IU] | ORAL_CAPSULE | ORAL | 0 refills | Status: DC
  Filled 2022-02-04: qty 5, 35d supply, fill #0

## 2022-02-05 LAB — VITAMIN D 25 HYDROXY (VIT D DEFICIENCY, FRACTURES): Vit D, 25-Hydroxy: 53.8 ng/mL (ref 30.0–100.0)

## 2022-02-06 ENCOUNTER — Other Ambulatory Visit (HOSPITAL_COMMUNITY): Payer: Self-pay

## 2022-02-18 NOTE — Progress Notes (Signed)
Chief Complaint:   OBESITY Felicia Sullivan is here to discuss her progress with her obesity treatment plan along with follow-up of her obesity related diagnoses. Felicia Sullivan is on the Category 2 Plan and states she is following her eating plan approximately 50% of the time. Felicia Sullivan states she is walking, bands and lifting weights 30 minutes 3-4 times per week.  Today's visit was #: 9 Starting weight: 200 lbs Starting date: 09/05/2021 Today's weight: 174 lbs Today's date: 01/07/2022 Total lbs lost to date: 26 lbs Total lbs lost since last in-office visit: 3 lbs  Interim History: Has had family in town for the holidays.  Plans to increase protein intake but she has backed off over the holidays.  She is using [employer] milk in her coffee. Patient is down 4.2 LB of body water.  Subjective:   1. Vitamin D deficiency On 09/05/2021, vitamin D level was 27.4.  It has been >2 years since last DEXA scan.  Patient is taking prescription vitamin D 50,000 IU weekly.  2. Transient total loss of muscle tone Muscle mass decreased 1.2 LB in 4 weeks.  Reviewed  macro) targets in my fitness pal.  Patient is working to add Felicia Sullivan at home.  Assessment/Plan:   1. Vitamin D deficiency DEXA scan ordered, recheck vitamin D level.  Refill- Vitamin D, Ergocalciferol, (DRISDOL) 1.25 MG (50000 UNIT) CAPS capsule; Take 1 capsule by mouth every 7 days.  Dispense: 5 capsule; Refill: 0  - VITAMIN D 25 Hydroxy (Vit-D Deficiency, Fractures)  Referral - DG Bone Density; Future  2. Transient total loss of muscle tone Need for 15 to 20 minutes resistance training 3 times per week.  Aim for 30% protein intake out of total daily calories.  Referral- DG Bone Density; Future  3. Obesity,current BMI 27.7 1.  Increase water intake to greater than 64 ounces per day. 2.  Use weight and bands at home 15 minutes 3 times per week. 3.  Breakfast alternative handout given. 4.  Protein alternative to meat given to patient  today  Zamora is currently in the action stage of change. As such, her goal is to continue with weight loss efforts. She has agreed to the Category 2 Plan.   Exercise goals:  As is.  Behavioral modification strategies: increasing lean protein intake, increasing vegetables, increasing water intake, no skipping meals, meal planning and cooking strategies, keeping healthy foods in the home, and planning for success.  Felicia Sullivan has agreed to follow-up with our clinic in 4 weeks. She was informed of the importance of frequent follow-up visits to maximize her success with intensive lifestyle modifications for her multiple health conditions.   Felicia Sullivan was informed we would discuss her lab results at her next visit unless there is a critical issue that needs to be addressed sooner. Felicia Sullivan agreed to keep her next visit at the agreed upon time to discuss these results.  Objective:   Blood pressure 129/75, pulse (!) 52, temperature (!) 97.5 F (36.4 C), height '5\' 6"'$  (1.676 m), weight 171 lb (77.6 kg), SpO2 100 %. Body mass index is 27.6 kg/m.  General: Cooperative, alert, well developed, in no acute distress. HEENT: Conjunctivae and lids unremarkable. Cardiovascular: Regular rhythm.  Lungs: Normal work of breathing. Neurologic: No focal deficits.   Lab Results  Component Value Date   CREATININE 0.73 09/05/2021   BUN 15 09/05/2021   NA 142 09/05/2021   K 4.8 09/05/2021   CL 103 09/05/2021   CO2 23 09/05/2021  Lab Results  Component Value Date   ALT 25 09/05/2021   AST 23 09/05/2021   ALKPHOS 94 09/05/2021   BILITOT 0.7 09/05/2021   Lab Results  Component Value Date   HGBA1C 5.5 09/05/2021   Lab Results  Component Value Date   INSULIN 14.9 09/05/2021   Lab Results  Component Value Date   TSH 0.844 09/05/2021   Lab Results  Component Value Date   CHOL 206 (H) 09/05/2021   HDL 85 09/05/2021   LDLCALC 110 (H) 09/05/2021   TRIG 58 09/05/2021   Lab Results  Component Value Date    VD25OH 53.8 02/04/2022   VD25OH 27.4 (L) 09/05/2021   Lab Results  Component Value Date   WBC 6.2 05/29/2012   HGB 14.0 05/29/2012   HCT 39.5 05/29/2012   MCV 90.2 05/29/2012   PLT 286 05/29/2012   No results found for: "IRON", "TIBC", "FERRITIN"  Attestation Statements:   Reviewed by clinician on day of visit: allergies, medications, problem list, medical history, surgical history, family history, social history, and previous encounter notes.  I, Davy Pique, am acting as Location manager for Loyal Gambler, DO.  I have reviewed the above documentation for accuracy and completeness, and I agree with the above. Dell Ponto, DO

## 2022-03-05 ENCOUNTER — Encounter (INDEPENDENT_AMBULATORY_CARE_PROVIDER_SITE_OTHER): Payer: Self-pay | Admitting: Family Medicine

## 2022-03-06 ENCOUNTER — Other Ambulatory Visit (HOSPITAL_COMMUNITY): Payer: Self-pay

## 2022-03-06 ENCOUNTER — Ambulatory Visit (INDEPENDENT_AMBULATORY_CARE_PROVIDER_SITE_OTHER): Payer: Commercial Managed Care - PPO | Admitting: Family Medicine

## 2022-03-06 ENCOUNTER — Encounter (INDEPENDENT_AMBULATORY_CARE_PROVIDER_SITE_OTHER): Payer: Self-pay | Admitting: Family Medicine

## 2022-03-06 VITALS — BP 118/75 | HR 57 | Temp 97.8°F | Ht 66.0 in | Wt 168.0 lb

## 2022-03-06 DIAGNOSIS — Z6827 Body mass index (BMI) 27.0-27.9, adult: Secondary | ICD-10-CM | POA: Diagnosis not present

## 2022-03-06 DIAGNOSIS — E559 Vitamin D deficiency, unspecified: Secondary | ICD-10-CM | POA: Diagnosis not present

## 2022-03-06 DIAGNOSIS — E669 Obesity, unspecified: Secondary | ICD-10-CM

## 2022-03-06 DIAGNOSIS — E88819 Insulin resistance, unspecified: Secondary | ICD-10-CM

## 2022-03-06 DIAGNOSIS — G47411 Narcolepsy with cataplexy: Secondary | ICD-10-CM

## 2022-03-06 MED ORDER — VITAMIN D (ERGOCALCIFEROL) 1.25 MG (50000 UNIT) PO CAPS
50000.0000 [IU] | ORAL_CAPSULE | ORAL | 0 refills | Status: DC
  Filled 2022-03-06: qty 5, 35d supply, fill #0

## 2022-03-06 NOTE — Assessment & Plan Note (Signed)
Recheck level 

## 2022-03-06 NOTE — Progress Notes (Incomplete)
Office: [phone]  /  Fax: [phone]  WEIGHT SUMMARY AND BIOMETRICS  Medical Weight Loss Height: '5\' 6"'$  (1.676 m) Weight: 168 lb (76.2 kg) Temp: 97.8 F (36.6 C) Pulse Rate: (!) 57 BP: 118/75 SpO2: 99 % Fasting: no Labs: no Today's Visit #: 9 Weight at Last VIsit: 171lb Weight Lost Since Last Visit: 3lb  Body Fat %: 32 % Fat Mass (lbs): 53.8 lbs Muscle Mass (lbs): 108.6 lbs Total Body Water (lbs): 75.8 lbs Visceral Fat Rating : 8    HPI  Chief Complaint: OBESITY  Felicia Sullivan is here to discuss her progress with her obesity treatment plan. She is on the the Category 2 Plan and states she is following her eating plan approximately *** % of the time. She states she is exercising *** minutes *** times per week.   Interval History:  Since last office visit she ***  Pharmacotherapy: none  PHYSICAL EXAM:  Blood pressure 118/75, pulse (!) 57, temperature 97.8 F (36.6 C), height '5\' 6"'$  (1.676 m), weight 168 lb (76.2 kg), SpO2 99 %. Body mass index is 27.12 kg/m.  General: She is overweight, cooperative, alert, well developed, and in no acute distress. PSYCH: Has normal mood, affect and thought process.   HEENT: EOMI, sclerae are anicteric. Lungs: Normal breathing effort, no conversational dyspnea. Extremities: No edema.  Neurologic: No gross sensory or motor deficits. No tremors or fasciculations noted.    DIAGNOSTIC DATA REVIEWED:  BMET    Component Value Date/Time   NA 142 09/05/2021 0855   K 4.8 09/05/2021 0855   CL 103 09/05/2021 0855   CO2 23 09/05/2021 0855   GLUCOSE 101 (H) 09/05/2021 0855   BUN 15 09/05/2021 0855   CREATININE 0.73 09/05/2021 0855   CALCIUM 9.5 09/05/2021 0855   Lab Results  Component Value Date   HGBA1C 5.5 09/05/2021   Lab Results  Component Value Date   INSULIN 14.9 09/05/2021   Lab Results  Component Value Date   TSH 0.844 09/05/2021   CBC    Component Value Date/Time   WBC 6.2 05/29/2012 0630   RBC 4.38 05/29/2012  0630   HGB 14.0 05/29/2012 0630   HCT 39.5 05/29/2012 0630   PLT 286 05/29/2012 0630   MCV 90.2 05/29/2012 0630   MCH 32.0 05/29/2012 0630   MCHC 35.4 05/29/2012 0630   RDW 12.6 05/29/2012 0630   Iron Studies No results found for: "IRON", "TIBC", "FERRITIN", "IRONPCTSAT" Lipid Panel     Component Value Date/Time   CHOL 206 (H) 09/05/2021 0855   TRIG 58 09/05/2021 0855   HDL 85 09/05/2021 0855   LDLCALC 110 (H) 09/05/2021 0855   Hepatic Function Panel     Component Value Date/Time   PROT 7.2 09/05/2021 0855   ALBUMIN 4.7 09/05/2021 0855   AST 23 09/05/2021 0855   ALT 25 09/05/2021 0855   ALKPHOS 94 09/05/2021 0855   BILITOT 0.7 09/05/2021 0855      Component Value Date/Time   TSH 0.844 09/05/2021 0855   Nutritional Lab Results  Component Value Date   VD25OH 53.8 02/04/2022   VD25OH 27.4 (L) 09/05/2021     ASSESSMENT AND PLAN  TREATMENT PLAN FOR OBESITY:  Recommended Dietary Goals  Felicia Sullivan is currently in the action stage of change. As such, her goal is to continue weight management plan. She has agreed to {MWMwtlossportion/plan2:23431}.  Behavioral Intervention  We discussed the following Behavioral Modification Strategies today: {EMWMwtlossstrategies:28914}.  Additional resources provided today: NA  Recommended Physical Activity  Goals  Felicia Sullivan has been advised to work up to 150 minutes of moderate intensity aerobic activity a week and strengthening exercises 2-3 times per week for cardiovascular health, weight loss maintenance and preservation of muscle mass.   She has agreed to {EMEXERCISE:28847::"increase physical activity in their day and reduce sedentary time (increase NEAT). "}   Pharmacotherapy We discussed various medication options to help Felicia Sullivan with her weight loss efforts and we both agreed to ***.  ASSOCIATED CONDITIONS ADDRESSED TODAY  Vitamin D deficiency Assessment & Plan: Recheck level   Generalized obesity  Transient total loss of  muscle tone  Insulin resistance  BMI 27.0-27.9,adult      No follow-ups on file.Marland Kitchen She was informed of the importance of frequent follow up visits to maximize her success with intensive lifestyle modifications for her multiple health conditions.   ATTESTASTION STATEMENTS:  Reviewed by clinician on day of visit: allergies, medications, problem list, medical history, surgical history, family history, social history, and previous encounter notes.   Time spent on visit including pre-visit chart review and post-visit care and charting was *** minutes.    Thomes Dinning, MD

## 2022-03-25 NOTE — Progress Notes (Signed)
Chief Complaint:   OBESITY Felicia Sullivan is here to discuss her progress with her obesity treatment plan along with follow-up of her obesity related diagnoses. [name] is on the Category 2 Plan and states she is following her eating plan approximately 90% of the time. [name] states she is walking, weights 30 minutes 3-5 times per week.  Today's visit was #: 10 Starting weight: 200 lbs Starting date: 09/05/2021 Today's weight: 168 lbs Today's date: 03/06/2022 Total lbs lost to date: 32 lbs Total lbs lost since last in-office visit: 3 lbs  Interim History: Patient staying mindful of food choices even with her daughter in town.  Patient is happy with 29 LBS of weight loss in 6 months patient has added resistance bands to walking and is liking Premier protein cereal.  Subjective:   1. Vitamin D deficiency Patient's Vitamin D level is improving from 27.4-53.8.  Patient is taking prescription vitamin D 50,000 IU weekly.  DEXA scheduled for July.  Assessment/Plan:   1. Vitamin D deficiency Patient's Vitamin D level is improving from 27.4-53.8.  Patient is taking prescription vitamin D 50,000 IU weekly.  DEXA scheduled for July.  Refill - Vitamin D, Ergocalciferol, (DRISDOL) 1.25 MG (50000 UNIT) CAPS capsule; Take 1 capsule by mouth every 7 days.  Dispense: 5 capsule; Refill: 0  2. Generalized obesity  3. Obesity,current BMI 27.1 1.  Add in one high fiber serving with dinner. 2.  Add in a multivitamin daily for hair thinning.  [name] is currently in the action stage of change. As such, her goal is to continue with weight loss efforts. She has agreed to the Category 2 Plan.   Exercise goals:  As is.   Behavioral modification strategies: increasing lean protein intake, decreasing simple carbohydrates, increasing water intake, increasing high fiber foods, decreasing eating out, no skipping meals, meal planning and cooking strategies, keeping healthy foods in the home, and planning for  success.  [name] has agreed to follow-up with our clinic in 4 weeks. She was informed of the importance of frequent follow-up visits to maximize her success with intensive lifestyle modifications for her multiple health conditions.   Objective:   Blood pressure 118/75, pulse (!) 57, temperature 97.8 F (36.6 C), height '5\' 6"'$  (1.676 m), weight 168 lb (76.2 kg), SpO2 99 %. Body mass index is 27.12 kg/m.  General: Cooperative, alert, well developed, in no acute distress. HEENT: Conjunctivae and lids unremarkable. Cardiovascular: Regular rhythm.  Lungs: Normal work of breathing. Neurologic: No focal deficits.   Lab Results  Component Value Date   CREATININE 0.73 09/05/2021   BUN 15 09/05/2021   NA 142 09/05/2021   K 4.8 09/05/2021   CL 103 09/05/2021   CO2 23 09/05/2021   Lab Results  Component Value Date   ALT 25 09/05/2021   AST 23 09/05/2021   ALKPHOS 94 09/05/2021   BILITOT 0.7 09/05/2021   Lab Results  Component Value Date   HGBA1C 5.5 09/05/2021   Lab Results  Component Value Date   INSULIN 14.9 09/05/2021   Lab Results  Component Value Date   TSH 0.844 09/05/2021   Lab Results  Component Value Date   CHOL 206 (H) 09/05/2021   HDL 85 09/05/2021   LDLCALC 110 (H) 09/05/2021   TRIG 58 09/05/2021   Lab Results  Component Value Date   VD25OH 53.8 02/04/2022   VD25OH 27.4 (L) 09/05/2021   Lab Results  Component Value Date   WBC 6.2 05/29/2012   HGB  14.0 05/29/2012   HCT 39.5 05/29/2012   MCV 90.2 05/29/2012   PLT 286 05/29/2012   No results found for: "IRON", "TIBC", "FERRITIN"  Attestation Statements:   Reviewed by clinician on day of visit: allergies, medications, problem list, medical history, surgical history, family history, social history, and previous encounter notes.  I, Davy Pique, am acting as Location manager for Loyal Gambler, DO.  I have reviewed the above documentation for accuracy and completeness, and I agree with the above. Dell Ponto, DO

## 2022-04-01 ENCOUNTER — Ambulatory Visit (INDEPENDENT_AMBULATORY_CARE_PROVIDER_SITE_OTHER): Payer: Commercial Managed Care - PPO | Admitting: Family Medicine

## 2022-05-01 ENCOUNTER — Ambulatory Visit (INDEPENDENT_AMBULATORY_CARE_PROVIDER_SITE_OTHER): Payer: Commercial Managed Care - PPO | Admitting: Family Medicine

## 2022-05-02 ENCOUNTER — Ambulatory Visit (INDEPENDENT_AMBULATORY_CARE_PROVIDER_SITE_OTHER): Payer: Commercial Managed Care - PPO | Admitting: Family Medicine

## 2022-05-02 ENCOUNTER — Encounter (INDEPENDENT_AMBULATORY_CARE_PROVIDER_SITE_OTHER): Payer: Self-pay | Admitting: Family Medicine

## 2022-05-02 VITALS — BP 118/58 | HR 58 | Temp 97.6°F | Ht 66.0 in | Wt 167.0 lb

## 2022-05-02 DIAGNOSIS — E559 Vitamin D deficiency, unspecified: Secondary | ICD-10-CM

## 2022-05-02 DIAGNOSIS — Z6826 Body mass index (BMI) 26.0-26.9, adult: Secondary | ICD-10-CM | POA: Diagnosis not present

## 2022-05-02 DIAGNOSIS — E7800 Pure hypercholesterolemia, unspecified: Secondary | ICD-10-CM | POA: Diagnosis not present

## 2022-05-02 DIAGNOSIS — G47411 Narcolepsy with cataplexy: Secondary | ICD-10-CM | POA: Diagnosis not present

## 2022-05-02 DIAGNOSIS — E669 Obesity, unspecified: Secondary | ICD-10-CM | POA: Diagnosis not present

## 2022-05-02 NOTE — Progress Notes (Signed)
Office: [phone]  /  Fax: [place]  Starting Date: 09/05/21  Starting Weight: 200lb   Weight Lost Since Last Visit: 1lb   Vitals Temp: 97.6 F (36.4 C) BP: (!) 118/58 Pulse Rate: (!) 58 SpO2: 100 %   Body Composition  Body Fat %: 33.3 % Fat Mass (lbs): 55.6 lbs Muscle Mass (lbs): 106 lbs Total Body Water (lbs): 75.6 lbs Visceral Fat Rating : 9   HPI  Chief Complaint: OBESITY  Felicia Sullivan is here to discuss her progress with her obesity treatment plan. She is on the the Category 2 Plan and states she is following her eating plan approximately 90 % of the time. She states she is exercising 30 minutes 3 to 5 times per week.  Interval History:  Since last office visit she is down 1 lb She has a net weight loss of 33 lb in 8 mos She is taking a late nap and gets 6-7 hrs of sleep at night She is feeling hungry at times She did lose 2.6 lb of muscle mass She did add a bit more fiber with dinner She allows 2 snacks at night- skinnypop, a yasso bar She is walking at work most days of the week She added in free weights at home 3-5 x a week  Pharmacotherapy: none  PHYSICAL EXAM:  Blood pressure (!) 118/58, pulse (!) 58, temperature 97.6 F (36.4 C), height 5\' 6"  (1.676 m), weight 167 lb (75.8 kg), SpO2 100 %. Body mass index is 26.95 kg/m.  General: She is normal weight, cooperative, alert, well developed, and in no acute distress. PSYCH: Has normal mood, affect and thought process.   Lungs: Normal breathing effort, no conversational dyspnea.   ASSESSMENT AND PLAN  TREATMENT PLAN FOR OBESITY:  Recommended Dietary Goals  Felicia Sullivan is currently in the action stage of change. As such, her goal is to continue weight management plan. She has agreed to logging daily intake with a goal of 1400 kcal/ day, to include 80-85 g of protein daily.  Behavioral Intervention  We discussed the following Behavioral Modification Strategies today:  increasing lean protein intake, increasing vegetables, increasing fiber rich foods, avoiding skipping meals, increasing water intake, work on meal planning and preparation, and work on tracking and journaling calories using tracking application.  Additional resources provided today: NA  Recommended Physical Activity Goals  Felicia Sullivan has been advised to work up to 150 minutes of moderate intensity aerobic activity a week and strengthening exercises 2-3 times per week for cardiovascular health, weight loss maintenance and preservation of muscle mass.   She has agreed to Increase the intensity, frequency or duration of strengthening exercises   Pharmacotherapy changes for the treatment of obesity:   ASSOCIATED CONDITIONS ADDRESSED TODAY  Vitamin D deficiency Assessment & Plan:  Last vitamin D Lab Results  Component Value Date   VD25OH 53.8 02/04/2022   She is taking RX vitamin D 50,000 IU weekly.  Energy level has improved.  Plan: d/c RX vitamin D weekly.  Change to OTC vitamin D 2,000 IU daily for maitenance Recheck level in 3-4 mos    Generalized obesity  Transient total loss of muscle tone Assessment & Plan: She continues to lose lean body mass, down 2.6 lb of muscle in the past 2 mos She has increased her dietary  protein intake and started using resistance bands  Plan: increase daily kcal to 1400 / day which should include 80-85 g of protein daily -- track on the  MyFitnessPal ap Continue to work on resistance training exercises 3-4 x a week   Elevated cholesterol Assessment & Plan: Lab Results  Component Value Date   CHOL 206 (H) 09/05/2021   HDL 85 09/05/2021   LDLCALC 110 (H) 09/05/2021   TRIG 58 09/05/2021   She has lost 30 lb in the past 7 mos and has done well on a low saturated fat diet.  Plan: recheck FLP with next labs      Repeat IC in June for maintenance phase  She was informed of the importance of frequent follow up visits to maximize her success with  intensive lifestyle modifications for her multiple health conditions.   ATTESTASTION STATEMENTS:  Reviewed by clinician on day of visit: allergies, medications, problem list, medical history, surgical history, family history, social history, and previous encounter notes pertinent to obesity diagnosis.   I have personally spent 30 minutes total time today in preparation, patient care, nutritional counseling and documentation for this visit, including the following: review of clinical lab tests; review of medical tests/procedures/services.      Dell Ponto, DO DABFM, DABOM [facility] [address], [place] [address] [phone]

## 2022-05-02 NOTE — Assessment & Plan Note (Addendum)
She continues to lose lean body mass, down 2.6 lb of muscle in the past 2 mos She has increased her dietary  protein intake and started using resistance bands  Plan: increase daily kcal to 1400 / day which should include 80-85 g of protein daily -- track on the MyFitnessPal ap Continue to work on resistance training exercises 3-4 x a week

## 2022-05-02 NOTE — Assessment & Plan Note (Signed)
Lab Results  Component Value Date   CHOL 206 (H) 09/05/2021   HDL 85 09/05/2021   LDLCALC 110 (H) 09/05/2021   TRIG 58 09/05/2021   She has lost 30 lb in the past 7 mos and has done well on a low saturated fat diet.  Plan: recheck FLP with next labs

## 2022-05-02 NOTE — Assessment & Plan Note (Signed)
Last vitamin D Lab Results  Component Value Date   VD25OH 53.8 02/04/2022   She is taking RX vitamin D 50,000 IU weekly.  Energy level has improved.  Plan: d/c RX vitamin D weekly.  Change to OTC vitamin D 2,000 IU daily for maitenance Recheck level in 3-4 mos

## 2022-05-07 ENCOUNTER — Other Ambulatory Visit (HOSPITAL_COMMUNITY): Payer: Self-pay

## 2022-05-07 MED ORDER — CHLORHEXIDINE GLUCONATE 0.12 % MT SOLN
OROMUCOSAL | 2 refills | Status: DC
  Filled 2022-05-07: qty 473, 7d supply, fill #0

## 2022-05-07 MED ORDER — TRAMADOL HCL 50 MG PO TABS
ORAL_TABLET | ORAL | 0 refills | Status: DC
  Filled 2022-05-07: qty 10, 2d supply, fill #0

## 2022-05-07 MED ORDER — CEFDINIR 300 MG PO CAPS
ORAL_CAPSULE | ORAL | 0 refills | Status: DC
  Filled 2022-05-07: qty 14, 7d supply, fill #0

## 2022-05-28 ENCOUNTER — Other Ambulatory Visit (HOSPITAL_COMMUNITY): Payer: Self-pay

## 2022-06-03 ENCOUNTER — Ambulatory Visit (INDEPENDENT_AMBULATORY_CARE_PROVIDER_SITE_OTHER): Payer: Commercial Managed Care - PPO | Admitting: Family Medicine

## 2022-06-03 ENCOUNTER — Encounter (INDEPENDENT_AMBULATORY_CARE_PROVIDER_SITE_OTHER): Payer: Self-pay | Admitting: Family Medicine

## 2022-06-03 VITALS — BP 114/71 | HR 57 | Temp 98.1°F | Ht 66.0 in | Wt 168.0 lb

## 2022-06-03 DIAGNOSIS — E88819 Insulin resistance, unspecified: Secondary | ICD-10-CM

## 2022-06-03 DIAGNOSIS — E669 Obesity, unspecified: Secondary | ICD-10-CM | POA: Diagnosis not present

## 2022-06-03 DIAGNOSIS — G47411 Narcolepsy with cataplexy: Secondary | ICD-10-CM

## 2022-06-03 DIAGNOSIS — E559 Vitamin D deficiency, unspecified: Secondary | ICD-10-CM

## 2022-06-03 DIAGNOSIS — Z6827 Body mass index (BMI) 27.0-27.9, adult: Secondary | ICD-10-CM | POA: Diagnosis not present

## 2022-06-03 NOTE — Progress Notes (Unsigned)
   Office: [phone]  /  Fax: [phone]  WEIGHT SUMMARY AND BIOMETRICS  Starting Date: 09/05/21  Starting Weight: 200lb   Weight Lost Since Last Visit: 0   Vitals Temp: 98.1 F (36.7 C) BP: 114/71 Pulse Rate: (!) 57 SpO2: 98 %   Body Composition  Body Fat %: 33.4 % Fat Mass (lbs): 56.2 lbs Muscle Mass (lbs): 106.4 lbs Total Body Water (lbs): 76 lbs Visceral Fat Rating : 9     HPI  Chief Complaint: OBESITY  Felicia Sullivan is here to discuss her progress with her obesity treatment plan. She is on the the Category 2 Plan and states she is following her eating plan approximately 90 % of the time. She states she is exercising 30 minutes 4-5 times per week.   Interval History:  Since last office visit she is up 1 lb She started to log on MyFitnessPal Ap and was not hitting her goal of 1400 cal/ day She is hitting her protein goal and is not skipping meals She is working out 4 days/ wk walking and free weights She is maintaining her muscle mass Her total weight loss is 32 lb  Pharmacotherapy: none  PHYSICAL EXAM:  Blood pressure 114/71, pulse (!) 57, temperature 98.1 F (36.7 C), height 5\' 6"  (1.676 m), weight 168 lb (76.2 kg), SpO2 98 %. Body mass index is 27.12 kg/m.  General: She is overweight, cooperative, alert, well developed, and in no acute distress. PSYCH: Has normal mood, affect and thought process.   Lungs: Normal breathing effort, no conversational dyspnea.   ASSESSMENT AND PLAN  TREATMENT PLAN FOR OBESITY:  Recommended Dietary Goals  Felicia Sullivan is currently in the action stage of change. As such, her goal is to continue weight management plan. She has agreed to the Category 2 Plan.  Behavioral Intervention  We discussed the following Behavioral Modification Strategies today: increasing lean protein intake, decreasing simple carbohydrates , increasing vegetables, increasing lower glycemic fruits, increasing water intake, work on meal planning and  preparation, reading food labels , continue to practice mindfulness when eating, and planning for success.  Additional resources provided today: NA  Recommended Physical Activity Goals  Felicia Sullivan has been advised to work up to 150 minutes of moderate intensity aerobic activity a week and strengthening exercises 2-3 times per week for cardiovascular health, weight loss maintenance and preservation of muscle mass.   She has agreed to Work on scheduling and tracking physical activity.   Pharmacotherapy changes for the treatment of obesity:   ASSOCIATED CONDITIONS ADDRESSED TODAY  There are no diagnoses linked to this encounter.    She was informed of the importance of frequent follow up visits to maximize her success with intensive lifestyle modifications for her multiple health conditions.   ATTESTASTION STATEMENTS:  Reviewed by clinician on day of visit: allergies, medications, problem list, medical history, surgical history, family history, social history, and previous encounter notes pertinent to obesity diagnosis.   I have personally spent 30 minutes total time today in preparation, patient care, nutritional counseling and documentation for this visit, including the following: review of clinical lab tests; review of medical tests/procedures/services.      Glennis Brink, DO DABFM, DABOM [facility] [address] [place], [place] [address] [phone]

## 2022-06-04 NOTE — Assessment & Plan Note (Signed)
Last vitamin D Lab Results  Component Value Date   VD25OH 53.8 02/04/2022   She is which served prescription vitamin D 50,000 IU once weekly to an over-the-counter vitamin D supplement at 2000 IU once daily.  Her energy level has been fair.  She denies adverse side effects.  Plan to update her vitamin D level next visit.

## 2022-06-04 NOTE — Assessment & Plan Note (Signed)
Improving According to her bioimpedance, she gained 0.4 pounds of muscle mass since last visit.  She has made a conscious effort to make sure she is getting an adequate protein and is logging on my fitness pal.  Of her total body weight loss since August 2023, she has lost 22.4 pounds of muscle mass of the 32 pounds total body weight loss. She has started adding in free weights.  Consider use of a personal trainer to help guide her with weight training 2 to 3 days a week.  Reviewed plan for weight training to do 10 reps of various exercises x 3-4 sets.  Weight should be heavy enough that by the 10th rep, it should be fairly hard.  It appears, that she is lifting very light weights.  She has done a better job with getting her protein in and muscle mass loss has ceased.  Continue to aim for 80 to 85 g of protein intake daily, logging on the my fitness pal app.

## 2022-06-04 NOTE — Assessment & Plan Note (Signed)
Last fasting insulin 14.9.  She has done a good job creating a caloric deficit, increasing her physical activity and reducing her intake of sugar.  She is still having some sugar cravings.  She has never been on metformin.  Repeat fasting insulin next visit.  Consider use of metformin.

## 2022-06-04 NOTE — Assessment & Plan Note (Signed)
Reviewed her overall progress.  My biggest concern, is that she is losing more muscle mass and body fat.  She has started logging her dietary intake.  Encouraged her to hit her top calorie target of 1400 cal/day.  She was under eating before logging.  She should be hitting her protein target between 80 and 85 g/day or more.  She has not needed antiobesity medication.  Continue to increase both resistance training and cardio exercise for total of 150 minutes or more of moderate intensity exercise weekly for maintenance of weight loss.  Repeat IC will be done next visit in preparation for maintenance

## 2022-07-08 ENCOUNTER — Encounter (INDEPENDENT_AMBULATORY_CARE_PROVIDER_SITE_OTHER): Payer: Self-pay | Admitting: Family Medicine

## 2022-07-08 ENCOUNTER — Ambulatory Visit (INDEPENDENT_AMBULATORY_CARE_PROVIDER_SITE_OTHER): Payer: Commercial Managed Care - PPO | Admitting: Family Medicine

## 2022-07-08 VITALS — BP 136/77 | HR 55 | Temp 98.2°F | Ht 66.0 in | Wt 166.0 lb

## 2022-07-08 DIAGNOSIS — E88819 Insulin resistance, unspecified: Secondary | ICD-10-CM | POA: Diagnosis not present

## 2022-07-08 DIAGNOSIS — Z6826 Body mass index (BMI) 26.0-26.9, adult: Secondary | ICD-10-CM | POA: Diagnosis not present

## 2022-07-08 DIAGNOSIS — R0602 Shortness of breath: Secondary | ICD-10-CM

## 2022-07-08 DIAGNOSIS — E7800 Pure hypercholesterolemia, unspecified: Secondary | ICD-10-CM

## 2022-07-08 DIAGNOSIS — E669 Obesity, unspecified: Secondary | ICD-10-CM | POA: Diagnosis not present

## 2022-07-08 DIAGNOSIS — E559 Vitamin D deficiency, unspecified: Secondary | ICD-10-CM

## 2022-07-08 NOTE — Assessment & Plan Note (Signed)
Lab Results  Component Value Date   CHOL 206 (H) 09/05/2021   HDL 85 09/05/2021   LDLCALC 110 (H) 09/05/2021   TRIG 58 09/05/2021   She has lost 17% TBW in the past 10 mos including a low saturated fat diet with regular exercise  Recheck FLP today

## 2022-07-08 NOTE — Assessment & Plan Note (Signed)
Doing well with weight loss, down 34 lb in the past 10 mos of medically supervised weight management She has been consistent with walking 30+ min 5 days/ wk and has cut back on starches and sweets Last fasting insulin elevated at 14.9, repeat level today.

## 2022-07-08 NOTE — Assessment & Plan Note (Signed)
IC test repeated today shows a slight drop in RMR from 1512 to 1469 cal/day.  This is likely from loss of muscle mass.  She has recently increased her resistance training exercise and has been getting at least 80 g of protein per day.  She is starting to gain back muscle mass.  Her percent body fat continues to decline.  Continue resistance training exercise at least 3 times a week for 10 to 15 minutes. Optimize protein intake to 80+ grams per day

## 2022-07-08 NOTE — Assessment & Plan Note (Signed)
Last vitamin D Lab Results  Component Value Date   VD25OH 53.8 02/04/2022   She has switched her vitamin D to over-the-counter vitamin D 2000 IU once daily.  Her energy level has been well.  Repeat vitamin D level today.

## 2022-07-08 NOTE — Progress Notes (Signed)
Office: [phone]  /  Fax: [phone]  WEIGHT SUMMARY AND BIOMETRICS  Starting Date: 09/05/21  Starting Weight: 200lb   Weight Lost Since Last Visit: 2lb   Vitals Temp: 98.2 F (36.8 C) BP: 136/77 Pulse Rate: (!) 55 SpO2: 100 %   Body Composition  Body Fat %: 32 % Fat Mass (lbs): 53.4 lbs Muscle Mass (lbs): 107.6 lbs Total Body Water (lbs): 72.8 lbs Visceral Fat Rating : 8     HPI  Chief Complaint: OBESITY  Felicia Sullivan is here to discuss her progress with her obesity treatment plan. She is on the the Category 2 Plan and states she is following her eating plan approximately 93 % of the time. She states she is exercising 30-40 minutes 4-5 times per week.   Interval History:  Since last office visit she is is down 2 lb She gained 1.2 lb of muscle mass and lost 2.8 lb of body fat She is walking and doing some weight training She does like to cook and bake She is tracking calories averaging 1400 each day She has been better about getting in lean protein with meals She has lost a total of 34 pounds in the past 10 months of medically supervised weight management This is a 17% total body weight loss.  IC test repeated today RMR 1512 to 1469 cal/day  Pharmacotherapy: None  PHYSICAL EXAM:  Blood pressure 136/77, pulse (!) 55, temperature 98.2 F (36.8 C), height 5\' 6"  (1.676 m), weight 166 lb (75.3 kg), SpO2 100 %. Body mass index is 26.79 kg/m.  General: She is pleasant, cooperative, alert, well developed, and in no acute distress. PSYCH: Has normal mood, affect and thought process.   Lungs: Normal breathing effort, no conversational dyspnea.   ASSESSMENT AND PLAN  TREATMENT PLAN FOR OBESITY:  Recommended Dietary Goals  Maigan is currently in the action stage of change. As such, her goal is to continue weight management plan. She has agreed to the Category 2 Plan.  Behavioral Intervention  We discussed the following Behavioral Modification Strategies  today: increasing lean protein intake, decreasing simple carbohydrates , increasing vegetables, increasing lower glycemic fruits, increasing water intake, keeping healthy foods at home, avoiding temptations and identifying enticing environmental cues, continue to practice mindfulness when eating, and planning for success.  Additional resources provided today: NA  Recommended Physical Activity Goals  Malaiah has been advised to work up to 150 minutes of moderate intensity aerobic activity a week and strengthening exercises 2-3 times per week for cardiovascular health, weight loss maintenance and preservation of muscle mass.   She has agreed to Continue current level of physical activity   Pharmacotherapy changes for the treatment of obesity: None  ASSOCIATED CONDITIONS ADDRESSED TODAY  SOBOE (shortness of breath on exertion)  Vitamin D deficiency Assessment & Plan: Last vitamin D Lab Results  Component Value Date   VD25OH 53.8 02/04/2022   She has switched her vitamin D to over-the-counter vitamin D 2000 IU once daily.  Her energy level has been well.  Repeat vitamin D level today.  Orders: -     VITAMIN D 25 Hydroxy (Vit-D Deficiency, Fractures)  Elevated cholesterol Assessment & Plan: Lab Results  Component Value Date   CHOL 206 (H) 09/05/2021   HDL 85 09/05/2021   LDLCALC 110 (H) 09/05/2021   TRIG 58 09/05/2021   She has lost 17% TBW in the past 10 mos including a low saturated fat diet with regular exercise  Recheck FLP today  Orders: -  Lipid panel  Insulin resistance Assessment & Plan: Doing well with weight loss, down 34 lb in the past 10 mos of medically supervised weight management She has been consistent with walking 30+ min 5 days/ wk and has cut back on starches and sweets Last fasting insulin elevated at 14.9, repeat level today.  Orders: -     Comprehensive metabolic panel -     Insulin, random  Generalized obesity with starting BMI 32 Assessment &  Plan: She is starting to rebuild muscle with resistance training exercises 3+ days/ wk and getting 80+ g of protein in each day Her % body fat is down to 32 and she has lost 17% TBW in 10 mos with a reduced calorie diet.  Repeated IC today and her RMR has dropped 43 calories per day in the past 10 mos She would like to lose 6 more pounds  Will keep her kcal 1400 per day for weight loss and adjust 1600-700 cal/ day for maintenance   BMI 26.0-26.9,adult  SOB (shortness of breath) on exertion Assessment & Plan: IC test repeated today shows a slight drop in RMR from 1512 to 1469 cal/day.  This is likely from loss of muscle mass.  She has recently increased her resistance training exercise and has been getting at least 80 g of protein per day.  She is starting to gain back muscle mass.  Her percent body fat continues to decline.  Continue resistance training exercise at least 3 times a week for 10 to 15 minutes. Optimize protein intake to 80+ grams per day       She was informed of the importance of frequent follow up visits to maximize her success with intensive lifestyle modifications for her multiple health conditions.   ATTESTASTION STATEMENTS:  Reviewed by clinician on day of visit: allergies, medications, problem list, medical history, surgical history, family history, social history, and previous encounter notes pertinent to obesity diagnosis.   I have personally spent 30 minutes total time today in preparation, patient care, nutritional counseling and documentation for this visit, including the following: review of clinical lab tests; review of medical tests/procedures/services.      Glennis Brink, DO DABFM, DABOM [facility] [address] [place], [place] [address] [phone]

## 2022-07-08 NOTE — Assessment & Plan Note (Signed)
She is starting to rebuild muscle with resistance training exercises 3+ days/ wk and getting 80+ g of protein in each day Her % body fat is down to 32 and she has lost 17% TBW in 10 mos with a reduced calorie diet.  Repeated IC today and her RMR has dropped 43 calories per day in the past 10 mos She would like to lose 6 more pounds  Will keep her kcal 1400 per day for weight loss and adjust 1600-700 cal/ day for maintenance

## 2022-07-09 LAB — LIPID PANEL
Chol/HDL Ratio: 2.2 ratio (ref 0.0–4.4)
Cholesterol, Total: 196 mg/dL (ref 100–199)
HDL: 91 mg/dL (ref >39)
LDL Chol Calc (NIH): 96 mg/dL (ref 0–99)
Triglycerides: 46 mg/dL (ref 0–149)
VLDL Cholesterol Cal: 9 mg/dL (ref 5–40)

## 2022-07-09 LAB — COMPREHENSIVE METABOLIC PANEL
ALT: 20 IU/L (ref 0–32)
AST: 23 IU/L (ref 0–40)
Albumin/Globulin Ratio: 1.7
Albumin: 4.6 g/dL (ref 3.9–4.9)
Alkaline Phosphatase: 84 IU/L (ref 44–121)
BUN/Creatinine Ratio: 24 (ref 12–28)
BUN: 18 mg/dL (ref 8–27)
Bilirubin Total: 0.6 mg/dL (ref 0.0–1.2)
CO2: 24 mmol/L (ref 20–29)
Calcium: 9.6 mg/dL (ref 8.7–10.3)
Chloride: 103 mmol/L (ref 96–106)
Creatinine, Ser: 0.74 mg/dL (ref 0.57–1.00)
Globulin, Total: 2.7 g/dL (ref 1.5–4.5)
Glucose: 93 mg/dL (ref 70–99)
Potassium: 4.6 mmol/L (ref 3.5–5.2)
Sodium: 142 mmol/L (ref 134–144)
Total Protein: 7.3 g/dL (ref 6.0–8.5)
eGFR: 89 mL/min/{1.73_m2} (ref >59)

## 2022-07-09 LAB — VITAMIN D 25 HYDROXY (VIT D DEFICIENCY, FRACTURES): Vit D, 25-Hydroxy: 54.1 ng/mL (ref 30.0–100.0)

## 2022-07-09 LAB — INSULIN, RANDOM: INSULIN: 6.3 u[IU]/mL (ref 2.6–24.9)

## 2022-07-16 ENCOUNTER — Telehealth: Payer: Self-pay

## 2022-07-16 NOTE — Telephone Encounter (Signed)
LM requesting call back to schedule NP appointment with Dr. Patsy Lager. [facility] has approved.

## 2022-07-31 ENCOUNTER — Ambulatory Visit
Admission: RE | Admit: 2022-07-31 | Discharge: 2022-07-31 | Disposition: A | Discharge status: Home / Self Care | Payer: Commercial Managed Care - PPO | Source: Ambulatory Visit | Attending: Family Medicine | Admitting: Family Medicine

## 2022-07-31 DIAGNOSIS — E349 Endocrine disorder, unspecified: Secondary | ICD-10-CM | POA: Diagnosis not present

## 2022-07-31 DIAGNOSIS — N958 Other specified menopausal and perimenopausal disorders: Secondary | ICD-10-CM | POA: Diagnosis not present

## 2022-07-31 DIAGNOSIS — E559 Vitamin D deficiency, unspecified: Secondary | ICD-10-CM

## 2022-07-31 DIAGNOSIS — G47411 Narcolepsy with cataplexy: Secondary | ICD-10-CM

## 2022-07-31 DIAGNOSIS — M8588 Other specified disorders of bone density and structure, other site: Secondary | ICD-10-CM | POA: Diagnosis not present

## 2022-08-02 ENCOUNTER — Other Ambulatory Visit (HOSPITAL_COMMUNITY): Payer: Self-pay

## 2022-08-05 ENCOUNTER — Other Ambulatory Visit (HOSPITAL_COMMUNITY): Payer: Self-pay

## 2022-08-06 ENCOUNTER — Ambulatory Visit (INDEPENDENT_AMBULATORY_CARE_PROVIDER_SITE_OTHER): Payer: Commercial Managed Care - PPO | Admitting: Family Medicine

## 2022-08-06 ENCOUNTER — Encounter (INDEPENDENT_AMBULATORY_CARE_PROVIDER_SITE_OTHER): Payer: Self-pay | Admitting: Family Medicine

## 2022-08-06 VITALS — BP 121/71 | HR 58 | Temp 97.7°F | Ht 66.0 in | Wt 168.0 lb

## 2022-08-06 DIAGNOSIS — E669 Obesity, unspecified: Secondary | ICD-10-CM | POA: Diagnosis not present

## 2022-08-06 DIAGNOSIS — Z6827 Body mass index (BMI) 27.0-27.9, adult: Secondary | ICD-10-CM

## 2022-08-06 DIAGNOSIS — M85851 Other specified disorders of bone density and structure, right thigh: Secondary | ICD-10-CM | POA: Diagnosis not present

## 2022-08-06 NOTE — Assessment & Plan Note (Signed)
Reviewed results of DEXA scan done 07/31/22 with patient showing a T score of the R femoral neck: -1.4.  She reports a history of osteopenia for over the last 10 years.  She has recently started on a calcium supplement once daily.  She is taking over-the-counter vitamin D 2000 IU once daily.  She is postmenopausal.  She will bring in her calcium supplement next visit to review dosage. Continue over-the-counter vitamin D 2000 IU once daily.  Last vitamin D level was within normal limits. Continue weightbearing exercise daily.

## 2022-08-06 NOTE — Assessment & Plan Note (Signed)
Reviewed overall progress with patient.  She is doing well overall with a reduced calorie and healthy diet.  She is logging most days of the week aiming for 1400 cal/day.  This should include about 85 g of protein daily.  She has lost a total of 32 pounds in the past 11 months of medically supervised weight management.  This is a 16% total body weight loss without use of antiobesity medications.  Continue working on [employer].  Will plan to change her over to a Mediterranean style diet in the next 2 to 3 months. Once she approaches maintenance around 160 pounds, we will move her calorie goal to 1600 to 1700 cal/day.  Will aim for over 150 minutes/week of moderate intensity exercise for maintenance of weight loss.

## 2022-08-06 NOTE — Progress Notes (Signed)
Office: [phone]  /  Fax: [phone]  WEIGHT SUMMARY AND BIOMETRICS  Starting Date: 09/05/21  Starting Weight: 200lb   Weight Lost Since Last Visit: 0lb   Vitals Temp: 97.7 F (36.5 C) BP: 121/71 Pulse Rate: (!) 58 SpO2: 98 %   Body Composition  Body Fat %: 32.8 % Fat Mass (lbs): 55.2 lbs Muscle Mass (lbs): 107 lbs Total Body Water (lbs): 74.8 lbs Visceral Fat Rating : 9   HPI  Chief Complaint: OBESITY  Felicia Sullivan is here to discuss her progress with her obesity treatment plan. She is on the the Category 2 Plan and states she is following her eating plan approximately 90 % of the time. She states she is exercising 30 minutes 4-5 times per week.   Interval History:  Since last office visit she is up 2 lb She has had some celebrations and her daughter has visited She is mindful of her snacks She did add in a 1/4 plate high fiber starch with dinner She is walking at work 4-5 x a day She is aiming for 10,000 steps per day  Pharmacotherapy: none   PHYSICAL EXAM:  Blood pressure 121/71, pulse (!) 58, temperature 97.7 F (36.5 C), height 5\' 6"  (1.676 m), weight 168 lb (76.2 kg), SpO2 98 %. Body mass index is 27.12 kg/m.  General: She is overweight, cooperative, alert, well developed, and in no acute distress. PSYCH: Has normal mood, affect and thought process.   Lungs: Normal breathing effort, no conversational dyspnea.   ASSESSMENT AND PLAN  TREATMENT PLAN FOR OBESITY:  Recommended Dietary Goals  Felicia Sullivan is currently in the action stage of change. As such, her goal is to continue weight management plan. She has agreed to keeping a food journal and adhering to recommended goals of 1400 calories and 85 g of protein.  Behavioral Intervention  We discussed the following Behavioral Modification Strategies today: increasing lean protein intake, decreasing simple carbohydrates , increasing vegetables, increasing lower glycemic fruits, increasing water intake,  keeping healthy foods at home, continue to practice mindfulness when eating, planning for success, and better snacking choices. -Discussed ways to get in lean protein without increasing meat intake  Additional resources provided today: NA  Recommended Physical Activity Goals  Felicia Sullivan has been advised to work up to 150 minutes of moderate intensity aerobic activity a week and strengthening exercises 2-3 times per week for cardiovascular health, weight loss maintenance and preservation of muscle mass.   She has agreed to Increase the intensity, frequency or duration of strengthening exercises  and Increase the intensity, frequency or duration of aerobic exercises    Pharmacotherapy changes for the treatment of obesity: None  ASSOCIATED CONDITIONS ADDRESSED TODAY  Osteopenia of neck of right femur Assessment & Plan: Reviewed results of DEXA scan done 07/31/22 with patient showing a T score of the R femoral neck: -1.4.  She reports a history of osteopenia for over the last 10 years.  She has recently started on a calcium supplement once daily.  She is taking over-the-counter vitamin D 2000 IU once daily.  She is postmenopausal.  She will bring in her calcium supplement next visit to review dosage. Continue over-the-counter vitamin D 2000 IU once daily.  Last vitamin D level was within normal limits. Continue weightbearing exercise daily.   Generalized obesity with starting BMI 32 Assessment & Plan: Reviewed overall progress with patient.  She is doing well overall with a reduced calorie and healthy diet.  She is logging most days of the  week aiming for 1400 cal/day.  This should include about 85 g of protein daily.  She has lost a total of 32 pounds in the past 11 months of medically supervised weight management.  This is a 16% total body weight loss without use of antiobesity medications.  Continue working on [employer].  Will plan to change her over to a Mediterranean style diet in the next  2 to 3 months. Once she approaches maintenance around 160 pounds, we will move her calorie goal to 1600 to 1700 cal/day.  Will aim for over 150 minutes/week of moderate intensity exercise for maintenance of weight loss.   BMI 27.0-27.9,adult      She was informed of the importance of frequent follow up visits to maximize her success with intensive lifestyle modifications for her multiple health conditions.   ATTESTASTION STATEMENTS:  Reviewed by clinician on day of visit: allergies, medications, problem list, medical history, surgical history, family history, social history, and previous encounter notes pertinent to obesity diagnosis.   I have personally spent 30 minutes total time today in preparation, patient care, nutritional counseling and documentation for this visit, including the following: review of clinical lab tests; review of medical tests/procedures/services.      Glennis Brink, DO DABFM, DABOM [facility] [address] [place], [place] [address] [phone]

## 2022-08-28 ENCOUNTER — Other Ambulatory Visit: Payer: Self-pay

## 2022-09-09 ENCOUNTER — Encounter (INDEPENDENT_AMBULATORY_CARE_PROVIDER_SITE_OTHER): Payer: Self-pay | Admitting: Family Medicine

## 2022-09-09 ENCOUNTER — Ambulatory Visit (INDEPENDENT_AMBULATORY_CARE_PROVIDER_SITE_OTHER): Payer: Commercial Managed Care - PPO | Admitting: Family Medicine

## 2022-09-09 VITALS — BP 137/78 | HR 60 | Temp 98.0°F | Ht 66.0 in | Wt 169.0 lb

## 2022-09-09 DIAGNOSIS — E669 Obesity, unspecified: Secondary | ICD-10-CM | POA: Diagnosis not present

## 2022-09-09 DIAGNOSIS — Z6827 Body mass index (BMI) 27.0-27.9, adult: Secondary | ICD-10-CM | POA: Diagnosis not present

## 2022-09-09 DIAGNOSIS — G47411 Narcolepsy with cataplexy: Secondary | ICD-10-CM

## 2022-09-09 DIAGNOSIS — M629 Disorder of muscle, unspecified: Secondary | ICD-10-CM

## 2022-09-09 DIAGNOSIS — E559 Vitamin D deficiency, unspecified: Secondary | ICD-10-CM | POA: Diagnosis not present

## 2022-09-09 NOTE — Assessment & Plan Note (Signed)
Muscle mass lost in 1 year: 21 lb out of 31 lb of weight loss.  This is 67% muscle loss of the total weight lost.  This is more than what is expected.  She has been tracking her intake of protein, reaching her goal of 80+ g/ day.  She is doing walking for exercise and has not been consistent with resistance training.    Recommend adding in resistance training 2 x a week (weights, yoga, pilates, water exercise, etc) Monitor for muscle gain in the next 2 mos Aim for 80+ g of dietary protein

## 2022-09-09 NOTE — Progress Notes (Signed)
Office: [phone]  /  Fax: [phone]  WEIGHT SUMMARY AND BIOMETRICS  Starting Date: 09/05/21  Starting Weight: 200lb   Weight Lost Since Last Visit: 0lb   Vitals Temp: 98 F (36.7 C) BP: 137/78 Pulse Rate: 60 SpO2: 100 %   Body Composition  Body Fat %: 32.9 % Fat Mass (lbs): 55.6 lbs Muscle Mass (lbs): 107.6 lbs Total Body Water (lbs): 76 lbs Visceral Fat Rating : 9   HPI  Chief Complaint: OBESITY  Felicia Sullivan is here to discuss her progress with her obesity treatment plan. She is on the the Category 2 Plan and states she is following her eating plan approximately 90 % of the time. She states she is exercising 30 minutes 4-5 times per week.   Interval History:  Since last office visit she is up 1 lb She is up 0.6 lb of muscle mass and up 0.4 lb of body fat She is still tracking her steps and doing well with cat 2 meal plan Occasional sweet cravings at night- allows 1-2 snacks at night She is walking 4-5 x a week She is getting 1200-1400 cal, logging daily She has good control over hunger She has a net weight loss of 31 lb in the past year of medically supervised weight management 21 lb has been muscle loss  Pharmacotherapy: none  PHYSICAL EXAM:  Blood pressure 137/78, pulse 60, temperature 98 F (36.7 C), height 5\' 6"  (1.676 m), weight 169 lb (76.7 kg), SpO2 100%. Body mass index is 27.28 kg/m.  General: She is overweight, cooperative, alert, well developed, and in no acute distress. PSYCH: Has normal mood, affect and thought process.   Lungs: Normal breathing effort, no conversational dyspnea.   ASSESSMENT AND PLAN  TREATMENT PLAN FOR OBESITY:  Recommended Dietary Goals  Felicia Sullivan is currently in the action stage of change. As such, her goal is to continue weight management plan. She has agreed to keeping a food journal and adhering to recommended goals of 1400 calories and 80g protein. - meditteranean diet info given on AVS - start incorporating into  meals - reduce processed food intake  Behavioral Intervention  We discussed the following Behavioral Modification Strategies today: increasing lean protein intake, decreasing simple carbohydrates , increasing vegetables, increasing lower glycemic fruits, increasing fiber rich foods, increasing water intake, work on meal planning and preparation, keeping healthy foods at home, continue to practice mindfulness when eating, and planning for success.  Additional resources provided today: NA  Recommended Physical Activity Goals  Felicia Sullivan has been advised to work up to 150 minutes of moderate intensity aerobic activity a week and strengthening exercises 2-3 times per week for cardiovascular health, weight loss maintenance and preservation of muscle mass.   She has agreed to [employer] strengthening exercises with a goal of 2-3 sessions a week  and Increase the intensity, frequency or duration of aerobic exercises    Pharmacotherapy changes for the treatment of obesity: none  ASSOCIATED CONDITIONS ADDRESSED TODAY  Transient total loss of muscle tone Assessment & Plan: Muscle mass lost in 1 year: 21 lb out of 31 lb of weight loss.  This is 67% muscle loss of the total weight lost.  This is more than what is expected.  She has been tracking her intake of protein, reaching her goal of 80+ g/ day.  She is doing walking for exercise and has not been consistent with resistance training.    Recommend adding in resistance training 2 x a week (weights, yoga, pilates, water exercise,  etc) Monitor for muscle gain in the next 2 mos Aim for 80+ g of dietary protein    Generalized obesity with starting BMI 32  BMI 27.0-27.9,adult  Vitamin D deficiency Assessment & Plan: Last vitamin D Lab Results  Component Value Date   VD25OH 54.1 07/08/2022   Doing well taking OTC vitamin D 2,000 international units  daily She will verify her home supplement dose via MyChart with me between visits Taking this for both  osteopenia and hx of vitamin D def.  Recheck level in the Winter       She was informed of the importance of frequent follow up visits to maximize her success with intensive lifestyle modifications for her multiple health conditions.   ATTESTASTION STATEMENTS:  Reviewed by clinician on day of visit: allergies, medications, problem list, medical history, surgical history, family history, social history, and previous encounter notes pertinent to obesity diagnosis.   I have personally spent 30 minutes total time today in preparation, patient care, nutritional counseling and documentation for this visit, including the following: review of clinical lab tests; review of medical tests/procedures/services.      Glennis Brink, DO DABFM, DABOM [facility] [address] [place], [place] [address] [phone]

## 2022-09-09 NOTE — Assessment & Plan Note (Signed)
Last vitamin D Lab Results  Component Value Date   VD25OH 54.1 07/08/2022   Doing well taking OTC vitamin D 2,000 international units  daily She will verify her home supplement dose via MyChart with me between visits Taking this for both osteopenia and hx of vitamin D def.  Recheck level in the Winter

## 2022-10-10 NOTE — Progress Notes (Addendum)
[facility] [address], [address] [place], [place] [address] [phone]  Date:  10/14/2022   Name:  Felicia Sullivan   DOB:  1955/05/27   MRN:  [id number]  PCP:  Patient, No Pcp Per    Chief Complaint: New Patient (Initial Visit) (Concerns/ questions: None/Hep C screen due/Tdap: UTD ([employer] employee)/Shingrix: none/Mammogram: August 2023 at Dr Emeline Darling office/Flu shot- will get through work)   History of Present Illness:  Felicia Sullivan is a 67 y.o. very pleasant female patient who presents with the following:  Seen today as a new patient She [facility] C[facility]y Endow [facility]  She works with the Cancer cancer as a patient navigator She is from [place], [place] over from [place] Married, 4 children- 3 grands ages 1, 32 nad 84 yo Kids are living in [place] (getting her PhD)   History of obesity, insulin resistance, osteopenia  Mammo- [facility]  Pap per Dr Lendell Caprice- she would like to do this soon but not today Pneumonia vaccine- give today  Tdap- during the last 10 years  Flu and covid vaccine- will do this fall She notes colon is UTD- 20221  She is getting some oral surgery this week- she has had a lot of issues with her teeth  She enjoys walking for exercise - she will do 20- 30 of walking daily  Never a smoker Occas etoh- maybe 4x a year Enjoys cooking and gardening   Patient Active Problem List   Diagnosis Date Noted   Osteopenia of neck of right femur 08/06/2022   Transient total loss of muscle tone 11/12/2021   Elevated cholesterol 09/19/2021   Insulin resistance 09/19/2021   Other fatigue 09/05/2021   SOB (shortness of breath) on exertion 09/05/2021   Vitamin D deficiency 09/05/2021   Health care maintenance 09/05/2021   Elevated glucose 09/05/2021   Generalized obesity with starting BMI 32 09/05/2021    Past Medical History:  Diagnosis Date   Allergy    Endometrial  thickening on ultra sound    Joint pain    Varicosities of leg     Past Surgical History:  Procedure Laterality Date   DILATATION & CURRETTAGE/HYSTEROSCOPY WITH RESECTOCOPE  11-24-2008  DR LOMAX   RESECTION FIBROID AND ENDOMETRIAL ABLATION   DILATATION AND EVACUATION  1989   HYSTEROSCOPY WITH D & C N/A 05/29/2012   Procedure: DILATATION AND CURETTAGE /HYSTEROSCOPY;  Surgeon: Sherron Monday, MD;  Location: [facility];  Service: Gynecology;  Laterality: N/A;   TONSILECTOMY, ADENOIDECTOMY, BILATERAL MYRINGOTOMY AND TUBES     tubes not placed in ears   TONSILLECTOMY  AGE 69   TUBAL LIGATION  1994   VARICOSE VEIN SURGERY     BILATERAL GREAT SAPHENOUS    Social History   Tobacco Use   Smoking status: Never   Smokeless tobacco: Never  Vaping Use   Vaping status: Never Used  Substance Use Topics   Alcohol use: Yes    Comment: Rare use   Drug use: No    Family History  Problem Relation Age of Onset   Stroke Mother    High blood pressure Mother    Depression Mother    Anxiety disorder Mother    Hearing loss Mother    Varicose Veins Mother    High blood pressure Father    High Cholesterol Father    Stroke Father  Hyperlipidemia Father    Hypertension Father    Vision loss Father    Pancreatic cancer Maternal Uncle    Throat cancer Cousin    Alcohol abuse Maternal Grandfather    Cancer Maternal Grandfather    Alcohol abuse Maternal Grandmother    ADD / ADHD Son    Learning disabilities Son    ADD / ADHD Son    ADD / ADHD Daughter    Alcohol abuse Brother    Cancer Maternal Uncle    Cancer Maternal Uncle    Diabetes Paternal Aunt    Obesity Paternal Aunt    Colon cancer Neg Hx    Esophageal cancer Neg Hx    Rectal cancer Neg Hx    Stomach cancer Neg Hx     Allergies  Allergen Reactions   Aspirin Rash   Penicillins Rash   Tetracyclines & Related Rash    Medication list has been reviewed and updated.  Current Outpatient Medications on File  Prior to Visit  Medication Sig Dispense Refill   Calcium Carb-Cholecalciferol (CALCIUM 1000 + D PO)      cholecalciferol (VITAMIN D3) 25 MCG (1000 UNIT) tablet      ibuprofen (ADVIL,MOTRIN) 200 MG tablet Take 3 tablets (600 mg total) by mouth every 6 (six) hours as needed for pain. 30 tablet 1   Multiple Vitamin (MULTIVITAMIN) capsule Take 1 capsule by mouth daily.     nystatin cream (MYCOSTATIN) APPLY TO THE AFFECTED AREAS OF SKIN TWICE DAILY AS NEEDED 30 g 2   pseudoephedrine (SUDAFED) 30 MG tablet Take 30 mg by mouth every 4 (four) hours as needed for congestion.     Psyllium (METAMUCIL 3 IN 1 DAILY FIBER) 400 MG CAPS in the morning and at bedtime.     triamcinolone cream (KENALOG) 0.1 % APPLY A THIN LAYER TO THE AFFECTED AREAS OF SKIN TWICE DAILY AS NEEDED 30 g 2   No current facility-administered medications on file prior to visit.    Review of Systems:  As per HPI- otherwise negative. Pulse Readings from Last 3 Encounters:  10/14/22 (!) 54  09/09/22 60  08/06/22 (!) 58      Physical Examination: Vitals:   10/14/22 1302  BP: 112/62  Pulse: (!) 54  Resp: 18  Temp: 97.7 F (36.5 C)  SpO2: 97%   Vitals:   10/14/22 1302  Weight: 170 lb 9.6 oz (77.4 kg)  Height: 5\' 5"  (1.651 m)   Body mass index is 28.39 kg/m. Ideal Body Weight: Weight in (lb) to have BMI = 25: 149.9  GEN: no acute distress. Mild over weight, looks well  HEENT: Atraumatic, Normocephalic.  Bilateral TM wnl, oropharynx normal.  PEERL,EOMI.   Ears and Nose: No external deformity. CV: RRR, No M/G/R. No JVD. No thrill. No extra heart sounds. PULM: CTA B, no wheezes, crackles, rhonchi. No retractions. No resp. distress. No accessory muscle use. ABD: S, NT, ND. No rebound. No HSM. EXTR: No c/c/e PSYCH: Normally interactive. Conversant.    Assessment and Plan: Glucose intolerance - Plan: Hemoglobin A1c  Elevated cholesterol - Plan: Lipid panel  Osteopenia of neck of right femur  Immunization due -  Plan: Pneumococcal conjugate vaccine 20-valent (Prevnar 20)  Thyroid disorder screening - Plan: TSH  Screening for deficiency anemia - Plan: CBC Patient seen today to establish care.  She has GYN care and is also seen [facility].  She has some recent blood work on chart but needs to catch  up on a few things as above.  She is also due for her pneumonia vaccine, gave today.  Recommend COVID booster, flu shot and Shingrix series at her convenience  Plan for follow-up in about 6 months assuming all is well  Signed Abbe Amsterdam, MD  Received labs and sent message to pt  Results for orders placed or performed in visit on 10/14/22  Hemoglobin A1c  Result Value Ref Range   Hgb A1c MFr Bld 5.6 4.6 - 6.5 %  Lipid panel  Result Value Ref Range   Cholesterol 180 0 - 200 mg/dL   Triglycerides 16.1 0.0 - 149.0 mg/dL   HDL 09.60 >45.40 mg/dL   VLDL 98.1 0.0 - 19.1 mg/dL   LDL Cholesterol 86 0 - 99 mg/dL   Total CHOL/HDL Ratio 2    NonHDL 97.74   TSH  Result Value Ref Range   TSH 0.74 0.35 - 5.50 uIU/mL  CBC  Result Value Ref Range   WBC 7.3 4.0 - 10.5 K/uL   RBC 4.28 3.87 - 5.11 Mil/uL   Platelets 288.0 150.0 - 400.0 K/uL   Hemoglobin 13.7 12.0 - 15.0 g/dL   HCT 47.8 29.5 - 62.1 %   MCV 96.1 78.0 - 100.0 fl   MCHC 33.3 30.0 - 36.0 g/dL   RDW 30.8 65.7 - 84.6 %

## 2022-10-14 ENCOUNTER — Encounter: Payer: Self-pay | Admitting: Family Medicine

## 2022-10-14 ENCOUNTER — Ambulatory Visit: Payer: Commercial Managed Care - PPO | Admitting: Family Medicine

## 2022-10-14 VITALS — BP 112/62 | HR 54 | Temp 97.7°F | Resp 18 | Ht 65.0 in | Wt 170.6 lb

## 2022-10-14 DIAGNOSIS — E7800 Pure hypercholesterolemia, unspecified: Secondary | ICD-10-CM

## 2022-10-14 DIAGNOSIS — Z130 Encounter for screening for diseases of the blood and blood-forming organs and certain disorders involving the immune mechanism: Secondary | ICD-10-CM

## 2022-10-14 DIAGNOSIS — M85851 Other specified disorders of bone density and structure, right thigh: Secondary | ICD-10-CM

## 2022-10-14 DIAGNOSIS — E7439 Other disorders of intestinal carbohydrate absorption: Secondary | ICD-10-CM | POA: Diagnosis not present

## 2022-10-14 DIAGNOSIS — Z1329 Encounter for screening for other suspected endocrine disorder: Secondary | ICD-10-CM | POA: Diagnosis not present

## 2022-10-14 DIAGNOSIS — Z23 Encounter for immunization: Secondary | ICD-10-CM

## 2022-10-14 LAB — CBC
HCT: 41.2 % (ref 36.0–46.0)
Hemoglobin: 13.7 g/dL (ref 12.0–15.0)
MCHC: 33.3 g/dL (ref 30.0–36.0)
MCV: 96.1 fl (ref 78.0–100.0)
Platelets: 288 10*3/uL (ref 150.0–400.0)
RBC: 4.28 Mil/uL (ref 3.87–5.11)
RDW: 12.8 % (ref 11.5–15.5)
WBC: 7.3 10*3/uL (ref 4.0–10.5)

## 2022-10-14 LAB — LIPID PANEL
Cholesterol: 180 mg/dL (ref 0–200)
HDL: 82.4 mg/dL (ref >39.00)
LDL Cholesterol: 86 mg/dL (ref 0–99)
NonHDL: 97.74
Total CHOL/HDL Ratio: 2
Triglycerides: 60 mg/dL (ref 0.0–149.0)
VLDL: 12 mg/dL (ref 0.0–40.0)

## 2022-10-14 LAB — HEMOGLOBIN A1C: Hgb A1c MFr Bld: 5.6 % (ref 4.6–6.5)

## 2022-10-14 LAB — TSH: TSH: 0.74 u[IU]/mL (ref 0.35–5.50)

## 2022-10-14 NOTE — Patient Instructions (Signed)
Great to see you today! I will be in touch with your labs Pneumonia vaccine today Recommend covid booster, flu shot this fall and Shingrix at your convenience  Best of luck with your gum graft- I hope it is no big deal!!

## 2022-10-17 ENCOUNTER — Other Ambulatory Visit (HOSPITAL_COMMUNITY): Payer: Self-pay

## 2022-10-17 MED ORDER — CHLORHEXIDINE GLUCONATE 0.12 % MT SOLN
OROMUCOSAL | 0 refills | Status: DC
  Filled 2022-10-17: qty 473, 16d supply, fill #0

## 2022-10-17 MED ORDER — CEFDINIR 300 MG PO CAPS
ORAL_CAPSULE | ORAL | 0 refills | Status: DC
  Filled 2022-10-17: qty 14, 7d supply, fill #0

## 2022-10-17 MED ORDER — HYDROCODONE-ACETAMINOPHEN 5-325 MG PO TABS
1.0000 | ORAL_TABLET | ORAL | 0 refills | Status: DC
  Filled 2022-10-17: qty 10, 2d supply, fill #0

## 2022-10-18 ENCOUNTER — Other Ambulatory Visit (HOSPITAL_COMMUNITY): Payer: Self-pay

## 2022-10-31 ENCOUNTER — Other Ambulatory Visit (HOSPITAL_COMMUNITY): Payer: Self-pay

## 2022-10-31 DIAGNOSIS — Z780 Asymptomatic menopausal state: Secondary | ICD-10-CM | POA: Diagnosis not present

## 2022-10-31 DIAGNOSIS — N9089 Other specified noninflammatory disorders of vulva and perineum: Secondary | ICD-10-CM | POA: Diagnosis not present

## 2022-10-31 DIAGNOSIS — Z1389 Encounter for screening for other disorder: Secondary | ICD-10-CM | POA: Diagnosis not present

## 2022-10-31 DIAGNOSIS — Z01419 Encounter for gynecological examination (general) (routine) without abnormal findings: Secondary | ICD-10-CM | POA: Diagnosis not present

## 2022-10-31 DIAGNOSIS — N341 Nonspecific urethritis: Secondary | ICD-10-CM | POA: Diagnosis not present

## 2022-10-31 DIAGNOSIS — Z1231 Encounter for screening mammogram for malignant neoplasm of breast: Secondary | ICD-10-CM | POA: Diagnosis not present

## 2022-10-31 MED ORDER — NYSTATIN 100000 UNIT/GM EX CREA
TOPICAL_CREAM | CUTANEOUS | 2 refills | Status: AC
  Filled 2022-10-31: qty 30, 10d supply, fill #0
  Filled 2023-08-22: qty 30, 10d supply, fill #1
  Filled 2023-10-17: qty 30, 10d supply, fill #2

## 2022-10-31 MED ORDER — TRIAMCINOLONE ACETONIDE 0.1 % EX CREA
TOPICAL_CREAM | CUTANEOUS | 2 refills | Status: AC
  Filled 2022-10-31: qty 30, 10d supply, fill #0
  Filled 2023-08-22: qty 30, 10d supply, fill #1
  Filled 2023-10-17: qty 30, 10d supply, fill #2

## 2022-10-31 MED ORDER — NITROFURANTOIN MONOHYD MACRO 100 MG PO CAPS
100.0000 mg | ORAL_CAPSULE | Freq: Two times a day (BID) | ORAL | 0 refills | Status: AC
  Filled 2022-10-31: qty 14, 7d supply, fill #0

## 2022-11-04 ENCOUNTER — Ambulatory Visit: Payer: Commercial Managed Care - PPO | Admitting: Family Medicine

## 2022-11-04 VITALS — BP 118/77 | HR 55 | Temp 97.8°F | Ht 66.0 in | Wt 165.0 lb

## 2022-11-04 DIAGNOSIS — G47411 Narcolepsy with cataplexy: Secondary | ICD-10-CM

## 2022-11-04 DIAGNOSIS — R632 Polyphagia: Secondary | ICD-10-CM | POA: Diagnosis not present

## 2022-11-04 DIAGNOSIS — Z6826 Body mass index (BMI) 26.0-26.9, adult: Secondary | ICD-10-CM

## 2022-11-04 DIAGNOSIS — E669 Obesity, unspecified: Secondary | ICD-10-CM

## 2022-11-04 NOTE — Assessment & Plan Note (Signed)
Hunger and cravings under good control with eating on a schedule, lean protein and fiber with meals Has not needed AOMs  Increase water intake to 90 oz/ day

## 2022-11-04 NOTE — Assessment & Plan Note (Signed)
Muscle loss has slowed down, losing primarily body fat on bioimpedence She has added in weight training from home 4-5 days/ wk She is averaging 80 g of protein daily  Continue to focus on lean protein intake and weight training to build back more muscle mass Consider more formal training with a personal trainer

## 2022-11-04 NOTE — Progress Notes (Signed)
Office: [phone]  /  Fax: [phone]  WEIGHT SUMMARY AND BIOMETRICS  Starting Date: 09/05/21  Starting Weight: 200lb   Weight Lost Since Last Visit: 4lb   Vitals Temp: 97.8 F (36.6 C) BP: 118/77 Pulse Rate: (!) 55 SpO2: 100 %   Body Composition  Body Fat %: 32.4 % Fat Mass (lbs): 53.8 lbs Muscle Mass (lbs): 106.4 lbs Total Body Water (lbs): 74.6 lbs Visceral Fat Rating : 8     HPI  Chief Complaint: OBESITY  Felicia Sullivan is here to discuss her progress with her obesity treatment plan. She is on the keeping a food journal and adhering to recommended goals of 1400 calories and 80 protein and states she is following her eating plan approximately 90 % of the time. She states she is exercising 30 minutes 4-5 times per week.   Interval History:  Since last office visit she is down 4 lb This gives her a net weight loss of 35 lb in the past 13 mos She did have some oral surgery done which limited chewing She has been walking  consistently at work She is working on hydrating well She added in weight training from home She is tracking steps, avg 8,000- 10,000 on work days She still craves sweets at times but limits how often she indulges She would like to lose about 10 more pounds  Pharmacotherapy: none  PHYSICAL EXAM:  Blood pressure 118/77, pulse (!) 55, temperature 97.8 F (36.6 C), height 5\' 6"  (1.676 m), weight 165 lb (74.8 kg), SpO2 100%. Body mass index is 26.63 kg/m.  General: She is overweight, cooperative, alert, well developed, and in no acute distress. PSYCH: Has normal mood, affect and thought process.   Lungs: Normal breathing effort, no conversational dyspnea.   ASSESSMENT AND PLAN  TREATMENT PLAN FOR OBESITY:  Recommended Dietary Goals  Felicia Sullivan is currently in the action stage of change. As such, her goal is to continue weight management plan. She has agreed to following a lower carbohydrate, vegetable and lean protein rich diet plan. -she has  been changing to a Mediterranean diet, handouts provided last visit  Behavioral Intervention  We discussed the following Behavioral Modification Strategies today: increasing lean protein intake to established goals, increasing vegetables, increasing fiber rich foods, work on meal planning and preparation, keeping healthy foods at home, avoiding temptations and identifying enticing environmental cues, planning for success, and continue to work on maintaining a reduced calorie state, getting the recommended amount of protein, incorporating whole foods, making healthy choices, staying well hydrated and practicing mindfulness when eating..  Additional resources provided today: NA  Recommended Physical Activity Goals  Felicia Sullivan has been advised to work up to 150 minutes of moderate intensity aerobic activity a week and strengthening exercises 2-3 times per week for cardiovascular health, weight loss maintenance and preservation of muscle mass.   She has agreed to Work on scheduling and tracking physical activity.   Pharmacotherapy changes for the treatment of obesity: none  ASSOCIATED CONDITIONS ADDRESSED TODAY  Polyphagia Assessment & Plan: Hunger and cravings under good control with eating on a schedule, lean protein and fiber with meals Has not needed AOMs  Increase water intake to 90 oz/ day   Generalized obesity with starting BMI 32  BMI 26.0-26.9,adult  Transient total loss of muscle tone Assessment & Plan: Muscle loss has slowed down, losing primarily body fat on bioimpedence She has added in weight training from home 4-5 days/ wk She is averaging 80 g of protein daily  Continue to focus on lean protein intake and weight training to build back more muscle mass Consider more formal training with a personal trainer       She was informed of the importance of frequent follow up visits to maximize her success with intensive lifestyle modifications for her multiple health  conditions.   ATTESTASTION STATEMENTS:  Reviewed by clinician on day of visit: allergies, medications, problem list, medical history, surgical history, family history, social history, and previous encounter notes pertinent to obesity diagnosis.   I have personally spent 30 minutes total time today in preparation, patient care, nutritional counseling and documentation for this visit, including the following: review of clinical lab tests; review of medical tests/procedures/services.      Glennis Brink, DO DABFM, DABOM [facility] [address] [place], [place] [address] [phone]

## 2022-11-12 ENCOUNTER — Ambulatory Visit: Payer: Commercial Managed Care - PPO | Admitting: Family Medicine

## 2022-12-02 ENCOUNTER — Ambulatory Visit: Payer: Commercial Managed Care - PPO | Admitting: Family Medicine

## 2022-12-02 ENCOUNTER — Ambulatory Visit: Payer: Commercial Managed Care - PPO | Admitting: Bariatrics

## 2023-01-01 ENCOUNTER — Encounter: Payer: Self-pay | Admitting: Family Medicine

## 2023-01-01 ENCOUNTER — Ambulatory Visit: Payer: Commercial Managed Care - PPO | Admitting: Family Medicine

## 2023-01-01 VITALS — BP 137/76 | HR 65 | Temp 97.7°F | Ht 66.0 in | Wt 168.0 lb

## 2023-01-01 DIAGNOSIS — E669 Obesity, unspecified: Secondary | ICD-10-CM | POA: Diagnosis not present

## 2023-01-01 DIAGNOSIS — Z6827 Body mass index (BMI) 27.0-27.9, adult: Secondary | ICD-10-CM | POA: Diagnosis not present

## 2023-01-01 NOTE — Progress Notes (Signed)
Office: [phone]  /  Fax: [phone]  WEIGHT SUMMARY AND BIOMETRICS  Starting Date: 09/05/21  Starting Weight: 200lb   Weight Lost Since Last Visit: 0lb   Vitals Temp: 97.7 F (36.5 C) BP: 137/76 Pulse Rate: 65 SpO2: 97 %   Body Composition  Body Fat %: 32.6 % Fat Mass (lbs): 55 lbs Muscle Mass (lbs): 107.8 lbs Total Body Water (lbs): 76.2 lbs Visceral Fat Rating : 9     HPI  Chief Complaint: OBESITY  Felicia Sullivan is here to discuss her progress with her obesity treatment plan. She is on the [place] diet and states she is following her eating plan approximately 85 % of the time. She states she is exercising 30-40 minutes 4 times per week.   Interval History:  Since last office visit she is up 3 lb She has a net weight loss of 32 lb in the past 15 mos of medically supervised weight management This is a 16% TBW loss She is happy around 165 lb She has adopted some of the Mediterranean diet into her routine She denies hunger or cravings She occasionally has sweets Work has been more stressful  She is walking and doing free weights 4 x a week She is considering retiring in the Spring She likes to track her calories and weigh in the morning for accountability  Pharmacotherapy: none  PHYSICAL EXAM:  Blood pressure 137/76, pulse 65, temperature 97.7 F (36.5 C), height 5\' 6"  (1.676 m), weight 168 lb (76.2 kg), SpO2 97%. Body mass index is 27.12 kg/m.  General: She is healthy appearing, cooperative, alert, well developed, and in no acute distress. PSYCH: Has normal mood, affect and thought process.   Lungs: Normal breathing effort, no conversational dyspnea.   ASSESSMENT AND PLAN  TREATMENT PLAN FOR OBESITY:  Recommended Dietary Goals  Felicia Sullivan is currently in the action stage of change. As such, her goal is to continue weight management plan. She has agreed to practicing portion control and making smarter food choices, such as increasing vegetables and  decreasing simple carbohydrates.  Behavioral Intervention  We discussed the following Behavioral Modification Strategies today: work on meal planning and preparation, work on tracking and journaling calories using tracking application, keeping healthy foods at home, practice mindfulness eating and understand the difference between hunger signals and cravings, planning for success, and continue to work on maintaining a reduced calorie state, getting the recommended amount of protein, incorporating whole foods, making healthy choices, staying well hydrated and practicing mindfulness when eating..  Additional resources provided today: NA  Recommended Physical Activity Goals  Felicia Sullivan has been advised to work up to 150 minutes of moderate intensity aerobic activity a week and strengthening exercises 2-3 times per week for cardiovascular health, weight loss maintenance and preservation of muscle mass.   She has agreed to Increase the intensity, frequency or duration of strengthening exercises  She plans to join [employer] with [employer]  Pharmacotherapy changes for the treatment of obesity: none  ASSOCIATED CONDITIONS ADDRESSED TODAY  Generalized obesity with starting BMI 32 Assessment & Plan: Reviewed overall progress with patient include body composition results She is starting to gain lean muscle mass back and has done great keeping her BMI at 27 and her body fat % at 32.  Her VFR remains < 10 showing improvements in her metabolic health.  She has added in tools to help her maintain her lower body weight such as dietary logging and morning weights.  She is consistently walking 150 min /  wk and using free weights.  She plans to add in more weight training at the gym, joining soon.  She has added in [employer] principals for her maintenance plan to be more heart healthy and feels satisfied.  Will continue to work on maintenance phase of weight loss with q 2 mos surveillance   BMI  27.0-27.9,adult      She was informed of the importance of frequent follow up visits to maximize her success with intensive lifestyle modifications for her multiple health conditions.   ATTESTASTION STATEMENTS:  Reviewed by clinician on day of visit: allergies, medications, problem list, medical history, surgical history, family history, social history, and previous encounter notes pertinent to obesity diagnosis.   I have personally spent 30 minutes total time today in preparation, patient care, nutritional counseling and documentation for this visit, including the following: review of clinical lab tests; review of medical tests/procedures/services.      Felicia Brink, DO DABFM, DABOM [facility] [address] [place], [place] [address] [phone]

## 2023-01-01 NOTE — Assessment & Plan Note (Signed)
Reviewed overall progress with patient include body composition results She is starting to gain lean muscle mass back and has done great keeping her BMI at 27 and her body fat % at 32.  Her VFR remains < 10 showing improvements in her metabolic health.  She has added in tools to help her maintain her lower body weight such as dietary logging and morning weights.  She is consistently walking 150 min / wk and using free weights.  She plans to add in more weight training at the gym, joining soon.  She has added in [employer] principals for her maintenance plan to be more heart healthy and feels satisfied.  Will continue to work on maintenance phase of weight loss with q 2 mos surveillance

## 2023-01-13 ENCOUNTER — Ambulatory Visit: Payer: Commercial Managed Care - PPO | Admitting: Family Medicine

## 2023-01-15 DIAGNOSIS — H524 Presbyopia: Secondary | ICD-10-CM | POA: Diagnosis not present

## 2023-01-15 DIAGNOSIS — H52203 Unspecified astigmatism, bilateral: Secondary | ICD-10-CM | POA: Diagnosis not present

## 2023-01-15 DIAGNOSIS — H5213 Myopia, bilateral: Secondary | ICD-10-CM | POA: Diagnosis not present

## 2023-03-04 ENCOUNTER — Encounter: Payer: Self-pay | Admitting: Family Medicine

## 2023-03-04 ENCOUNTER — Ambulatory Visit: Payer: Commercial Managed Care - PPO | Admitting: Family Medicine

## 2023-03-04 VITALS — BP 116/74 | HR 61 | Temp 98.2°F | Ht 66.0 in | Wt 169.0 lb

## 2023-03-04 DIAGNOSIS — Z6827 Body mass index (BMI) 27.0-27.9, adult: Secondary | ICD-10-CM

## 2023-03-04 DIAGNOSIS — E88819 Insulin resistance, unspecified: Secondary | ICD-10-CM | POA: Diagnosis not present

## 2023-03-04 DIAGNOSIS — E669 Obesity, unspecified: Secondary | ICD-10-CM | POA: Diagnosis not present

## 2023-03-04 NOTE — Assessment & Plan Note (Signed)
 She has done well over the past 1.5 years and is within 5 lb of her target weight Reviewed bioimpedence results She is starting to gain back lean muscle mass with workouts and protein intake (great!) and her motivation to continue to work on lean protein and fiber intake with meals is good  Return in 2 mos

## 2023-03-04 NOTE — Progress Notes (Signed)
 Office: [phone]  /  Fax: [phone]  WEIGHT SUMMARY AND BIOMETRICS  Starting Date: 09/05/21  Starting Weight: 200lb   Weight Lost Since Last Visit: 0lb   Vitals Temp: 98.2 F (36.8 C) BP: 116/74 Pulse Rate: 61 SpO2: 98 %   Body Composition  Body Fat %: 31.9 % Fat Mass (lbs): 54 lbs Muscle Mass (lbs): 109.2 lbs Total Body Water (lbs): 76.2 lbs Visceral Fat Rating : 8    HPI  Chief Complaint: OBESITY  Felicia Sullivan is here to discuss her progress with her obesity treatment plan. She is on the the Category 2 Plan and states she is following her eating plan approximately 80 % of the time. She states she is exercising 30 minutes 4 times per week.  Interval History:  Since last office visit she is up 1 lb This gives her a net weight loss of 31 lb in the past 17 mos She is looking to retire in August She is up 1.4 of muscle mass and is down 1 lb of body fat in the past 2 mos She is walking at work and doing some free weights She would like to lose 5 lb and plans to stay on plan as much as possible She did get a bit off track with the holidays but is back on track now    Pharmacotherapy: none  PHYSICAL EXAM:  Blood pressure 116/74, pulse 61, temperature 98.2 F (36.8 C), height 5' 6 (1.676 m), weight 169 lb (76.7 kg), SpO2 98%. Body mass index is 27.28 kg/m.  General: She is healthy appearing,  cooperative, alert, well developed, and in no acute distress. PSYCH: Has normal mood, affect and thought process.   Lungs: Normal breathing effort, no conversational dyspnea.   ASSESSMENT AND PLAN  TREATMENT PLAN FOR OBESITY:  Recommended Dietary Goals  [name] is currently in the action stage of change. As such, her goal is to continue weight management plan. She has agreed to the Category 2 Plan.  Behavioral Intervention  We discussed the following Behavioral Modification Strategies today: increasing lean protein intake to established goals, increasing fiber rich  foods, increasing water intake , work on meal planning and preparation, keeping healthy foods at home, avoiding temptations and identifying enticing environmental cues, planning for success, and continue to work on maintaining a reduced calorie state, getting the recommended amount of protein, incorporating whole foods, making healthy choices, staying well hydrated and practicing mindfulness when eating..  Additional resources provided today: NA  Recommended Physical Activity Goals  [name] has been advised to work up to 150 minutes of moderate intensity aerobic activity a week and strengthening exercises 2-3 times per week for cardiovascular health, weight loss maintenance and preservation of muscle mass.   She has agreed to Work on scheduling and tracking physical activity.   Pharmacotherapy changes for the treatment of obesity: none  ASSOCIATED CONDITIONS ADDRESSED TODAY  Insulin  resistance Assessment & Plan: Improved fasting insulin  with lifestyle changes and weight loss over the past 1.5 years of medically supervised weight management. Body fat % down to 31 and VFR down to 8 showing improvements in metabolic health  She plans to resume cat 2 meal plan with walking 30 min 4-5 days/ wk and weight training 10-15 min 3 days/ wk Congratulated her on her improved metabolic health readings   Generalized obesity with starting BMI 32 Assessment & Plan: She has done well over the past 1.5 years and is within 5 lb of her target weight Reviewed bioimpedence results She  is starting to gain back lean muscle mass with workouts and protein intake (great!) and her motivation to continue to work on lean protein and fiber intake with meals is good  Return in 2 mos    BMI 27.0-27.9,adult      She was informed of the importance of frequent follow up visits to maximize her success with intensive lifestyle modifications for her multiple health conditions.   ATTESTASTION STATEMENTS:  Reviewed by  clinician on day of visit: allergies, medications, problem list, medical history, surgical history, family history, social history, and previous encounter notes pertinent to obesity diagnosis.   I have personally spent 30 minutes total time today in preparation, patient care, nutritional counseling and documentation for this visit, including the following: review of clinical lab tests; review of medical tests/procedures/services.      Darice FORBES Haddock, DO DABFM, DABOM [facility] [address] [place], [place] [address] [phone]

## 2023-03-04 NOTE — Assessment & Plan Note (Signed)
 Improved fasting insulin  with lifestyle changes and weight loss over the past 1.5 years of medically supervised weight management. Body fat % down to 31 and VFR down to 8 showing improvements in metabolic health  She plans to resume cat 2 meal plan with walking 30 min 4-5 days/ wk and weight training 10-15 min 3 days/ wk Congratulated her on her improved metabolic health readings

## 2023-04-24 ENCOUNTER — Other Ambulatory Visit (HOSPITAL_COMMUNITY): Payer: Self-pay

## 2023-04-24 MED ORDER — HYDROCODONE-ACETAMINOPHEN 5-325 MG PO TABS
1.0000 | ORAL_TABLET | Freq: Every day | ORAL | 0 refills | Status: DC
  Filled 2023-04-24: qty 10, 10d supply, fill #0

## 2023-04-24 MED ORDER — CEFDINIR 300 MG PO CAPS
300.0000 mg | ORAL_CAPSULE | Freq: Two times a day (BID) | ORAL | 0 refills | Status: DC
  Filled 2023-04-24: qty 14, 7d supply, fill #0

## 2023-05-07 ENCOUNTER — Other Ambulatory Visit (HOSPITAL_COMMUNITY): Payer: Self-pay

## 2023-05-13 ENCOUNTER — Ambulatory Visit: Payer: Commercial Managed Care - PPO | Admitting: Family Medicine

## 2023-05-19 ENCOUNTER — Encounter: Payer: Self-pay | Admitting: Family Medicine

## 2023-05-19 ENCOUNTER — Ambulatory Visit: Payer: Commercial Managed Care - PPO | Admitting: Family Medicine

## 2023-05-19 VITALS — BP 138/78 | HR 64 | Temp 98.1°F | Ht 66.0 in | Wt 172.0 lb

## 2023-05-19 DIAGNOSIS — R632 Polyphagia: Secondary | ICD-10-CM | POA: Diagnosis not present

## 2023-05-19 DIAGNOSIS — E88819 Insulin resistance, unspecified: Secondary | ICD-10-CM | POA: Diagnosis not present

## 2023-05-19 DIAGNOSIS — E669 Obesity, unspecified: Secondary | ICD-10-CM

## 2023-05-19 DIAGNOSIS — Z6827 Body mass index (BMI) 27.0-27.9, adult: Secondary | ICD-10-CM | POA: Diagnosis not present

## 2023-05-19 NOTE — Progress Notes (Signed)
   Office: [phone]  /  Fax: [phone]  WEIGHT SUMMARY AND BIOMETRICS  Starting Date: 09/05/21  Starting Weight: 200 lb   Weight Lost Since Last Visit: 0   Vitals Temp: 98.1 F (36.7 C) BP: 138/78 Pulse Rate: 64 SpO2: 100 %   Body Composition  Body Fat %: 34.8 % Fat Mass (lbs): 60 lbs Muscle Mass (lbs): 106.8 lbs Total Body Water (lbs): 79 lbs Visceral Fat Rating : 9   HPI  Chief Complaint: OBESITY  Felicia Sullivan is here to discuss her progress with her obesity treatment plan. She is on the the Category 2 Plan and states she is following her eating plan approximately 75 % of the time. She states she is exercising 30 minutes 4-5 times per week.  Interval History:  Since last office visit she is up 3 lb  She has some hunger, cravings She did get bored of cat 2 meal plan She is getting in fiber with meals She is retaining fluid from ham last night She likes sweets in the evenings She is still walking 4-5 days/ wk  Pharmacotherapy: none  PHYSICAL EXAM:  Blood pressure 138/78, pulse 64, temperature 98.1 F (36.7 C), height 5\' 6"  (1.676 m), weight 172 lb (78 kg), SpO2 100%. Body mass index is 27.76 kg/m.  General: She is overweight, cooperative, alert, well developed, and in no acute distress. PSYCH: Has normal mood, affect and thought process.   Lungs: Normal breathing effort, no conversational dyspnea.   ASSESSMENT AND PLAN  TREATMENT PLAN FOR OBESITY:  Recommended Dietary Goals  Felicia Sullivan is currently in the action stage of change. As such, her goal is to continue weight management plan. She has agreed to {MWMwtlossportion/plan2:23431}.  Behavioral Intervention  We discussed the following Behavioral Modification Strategies today: {EMWMwtlossstrategies:28914::"continue to work on maintaining a reduced calorie state, getting the recommended amount of protein, incorporating whole foods, making healthy choices, staying well hydrated and practicing mindfulness when  eating."}.  Additional resources provided today: NA  Recommended Physical Activity Goals  Felicia Sullivan has been advised to work up to 150 minutes of moderate intensity aerobic activity a week and strengthening exercises 2-3 times per week for cardiovascular health, weight loss maintenance and preservation of muscle mass.   She has agreed to {EMEXERCISE:28847::"Think about enjoyable ways to increase daily physical activity and overcoming barriers to exercise","Increase physical activity in their day and reduce sedentary time (increase NEAT)."}  Pharmacotherapy changes for the treatment of obesity:   ASSOCIATED CONDITIONS ADDRESSED TODAY  There are no diagnoses linked to this encounter.    She was informed of the importance of frequent follow up visits to maximize her success with intensive lifestyle modifications for her multiple health conditions.   ATTESTASTION STATEMENTS:  Reviewed by clinician on day of visit: allergies, medications, problem list, medical history, surgical history, family history, social history, and previous encounter notes pertinent to obesity diagnosis.   I have personally spent 30 minutes total time today in preparation, patient care, nutritional counseling and education,  and documentation for this visit, including the following: review of most recent clinical lab tests, prescribing medications/ refilling medications, reviewing medical assistant documentation, review and interpretation of bioimpedence results.     Felicia Sullivan, D.O. DABFM, DABOM [facility] [address] [place], [place] [address] [phone]

## 2023-07-15 ENCOUNTER — Ambulatory Visit: Admitting: Family Medicine

## 2023-07-15 ENCOUNTER — Encounter: Payer: Self-pay | Admitting: Family Medicine

## 2023-07-15 VITALS — BP 131/78 | HR 60 | Temp 98.6°F | Ht 66.0 in | Wt 172.0 lb

## 2023-07-15 DIAGNOSIS — E669 Obesity, unspecified: Secondary | ICD-10-CM | POA: Diagnosis not present

## 2023-07-15 DIAGNOSIS — Z6827 Body mass index (BMI) 27.0-27.9, adult: Secondary | ICD-10-CM

## 2023-07-15 DIAGNOSIS — E88819 Insulin resistance, unspecified: Secondary | ICD-10-CM

## 2023-07-15 NOTE — Progress Notes (Signed)
 Office: [phone]  /  Fax: [phone]  WEIGHT SUMMARY AND BIOMETRICS  Starting Date: 09/05/21  Starting Weight: 200lb   Weight Lost Since Last Visit: 0lb   Vitals Temp: 98.6 F (37 C) BP: 131/78 Pulse Rate: 60 SpO2: 99 %   Body Composition  Body Fat %: 33.1 % Fat Mass (lbs): 57 lbs Muscle Mass (lbs): 109.6 lbs Total Body Water (lbs): 77 lbs Visceral Fat Rating : 9    HPI  Chief Complaint: OBESITY  Felicia Sullivan is here to discuss her progress with her obesity treatment plan. She is on the the Category 2 Plan and states she is following her eating plan approximately 75 % of the time. She states she is exercising 30 minutes 4-5 times per week.  Interval History:  Since last office visit she is down 0 lb She is up 2.8 lb of muscle mass and down 3 lb of body fat in the past 2 mos She plans to retire early August She has been walking in the yard and walking at work She is net down 28 lb in the  She is weight training from home 4 days/ wk but is considering going to a gym after she retires She remains mindful of food choices with some sugar cravings  Pharmacotherapy: none  PHYSICAL EXAM:  Blood pressure 131/78, pulse 60, temperature 98.6 F (37 C), height 5' 6 (1.676 m), weight 172 lb (78 kg), SpO2 99%. Body mass index is 27.76 kg/m.  General: She is healthy appearing,  cooperative, alert, well developed, and in no acute distress. PSYCH: Has normal mood, affect and thought process.   Lungs: Normal breathing effort, no conversational dyspnea.   ASSESSMENT AND PLAN  TREATMENT PLAN FOR OBESITY:  Recommended Dietary Goals  Nicoletta is currently in the action stage of change. As such, her goal is to continue weight management plan. She has agreed to practicing portion control and making smarter food choices, such as increasing vegetables and decreasing simple carbohydrates.  Behavioral Intervention  We discussed the following Behavioral Modification Strategies  today: increasing lean protein intake to established goals, increasing fiber rich foods, increasing water intake , keeping healthy foods at home, planning for success, and continue to work on maintaining a reduced calorie state, getting the recommended amount of protein, incorporating whole foods, making healthy choices, staying well hydrated and practicing mindfulness when eating..  Additional resources provided today: NA  Recommended Physical Activity Goals  Darnise has been advised to work up to 150 minutes of moderate intensity aerobic activity a week and strengthening exercises 2-3 times per week for cardiovascular health, weight loss maintenance and preservation of muscle mass.   She has agreed to Increase the intensity, frequency or duration of strengthening exercises  and Increase the intensity, frequency or duration of aerobic exercises    Pharmacotherapy changes for the treatment of obesity: none  ASSOCIATED CONDITIONS ADDRESSED TODAY  Insulin  resistance Fasting insulin  improved from 14.9--> 6.3 with healthy lifestyle changes and weight reduction Doing well with both cardio and resistance training Continue to work on limiting added sugar Check out gym options for more formal exercise after retiring  BMI 27.0-27.9,adult  Generalized obesity with starting BMI 32      She was informed of the importance of frequent follow up visits to maximize her success with intensive lifestyle modifications for her multiple health conditions.   ATTESTASTION STATEMENTS:  Reviewed by clinician on day of visit: allergies, medications, problem list, medical history, surgical history, family history, social history, and  previous encounter notes pertinent to obesity diagnosis.      Micky Albee, D.O. DABFM, DABOM [facility] [address] [place], [place] [address] [phone]

## 2023-07-17 ENCOUNTER — Other Ambulatory Visit (HOSPITAL_COMMUNITY): Payer: Self-pay

## 2023-09-24 ENCOUNTER — Ambulatory Visit: Admitting: Family Medicine

## 2023-10-01 ENCOUNTER — Ambulatory Visit (INDEPENDENT_AMBULATORY_CARE_PROVIDER_SITE_OTHER): Admitting: Family Medicine

## 2023-10-01 ENCOUNTER — Encounter: Payer: Self-pay | Admitting: Family Medicine

## 2023-10-01 VITALS — BP 131/81 | HR 62 | Temp 98.1°F | Ht 66.0 in | Wt 179.0 lb

## 2023-10-01 DIAGNOSIS — E559 Vitamin D deficiency, unspecified: Secondary | ICD-10-CM | POA: Diagnosis not present

## 2023-10-01 DIAGNOSIS — Z6828 Body mass index (BMI) 28.0-28.9, adult: Secondary | ICD-10-CM

## 2023-10-01 DIAGNOSIS — E663 Overweight: Secondary | ICD-10-CM

## 2023-10-01 DIAGNOSIS — Z8639 Personal history of other endocrine, nutritional and metabolic disease: Secondary | ICD-10-CM | POA: Diagnosis not present

## 2023-10-01 NOTE — Progress Notes (Unsigned)
 Office: [phone]  /  Fax: [phone]  WEIGHT SUMMARY AND BIOMETRICS  Starting Date: 09/05/21  Starting Weight: 200lb   Weight Lost Since Last Visit: 0lb   Vitals Temp: 98.1 F (36.7 C) BP: 131/81 Pulse Rate: 62 SpO2: 97 %   Body Composition  Body Fat %: 35.5 % Fat Mass (lbs): 63.6 lbs Muscle Mass (lbs): 109.6 lbs Total Body Water (lbs): 74.6 lbs Visceral Fat Rating : 10   HPI  Chief Complaint: OBESITY  Felicia Sullivan is here to discuss her progress with her obesity treatment plan. She is on the the Category 2 Plan and states she is following her eating plan approximately 30 % of the time. She states she is walking and weights for 30-60 minutes 3-4 times per week.  Interval History:  Since last office visit she is up 7 lb last seen 2 mos ago This gives her a net weight loss of 21 lb in the past 2 years of medically supervised weight management This is a 10.5% TBW loss She maintained her lean body mass and is up 6.6 lb of body fat She did retire a month ago She had a cross country road trip with lots of sitting and snacking with her daughter She celebrated her retirement Her husband is at home with her and he is diabetic She plans to join a gym with [employer] She plans to do some outdoor walking as well She denies excessive eating or snacking  Pharmacotherapy: none  PHYSICAL EXAM:  Blood pressure 131/81, pulse 62, temperature 98.1 F (36.7 C), height 5' 6 (1.676 m), weight 179 lb (81.2 kg), SpO2 97%. Body mass index is 28.89 kg/m.  General: She is healthy appearing, cooperative, alert, well developed, and in no acute distress. PSYCH: Has normal mood, affect and thought process.   Lungs: Normal breathing effort, no conversational dyspnea.  ASSESSMENT AND PLAN  TREATMENT PLAN FOR OBESITY:  Recommended Dietary Goals  Versia is currently in the action stage of change. As such, her goal is to continue weight management plan. She has agreed to 1200-1400  cal/ day mediterranean style diet  Behavioral Intervention  We discussed the following Behavioral Modification Strategies today: increasing lean protein intake to established goals, increasing fiber rich foods, increasing water intake , keeping healthy foods at home, avoiding temptations and identifying enticing environmental cues, and planning for success.  Additional resources provided today: NA  Recommended Physical Activity Goals  Temprence has been advised to work up to 150 minutes of moderate intensity aerobic activity a week and strengthening exercises 2-3 times per week for cardiovascular health, weight loss maintenance and preservation of muscle mass.   She has agreed to [employer] strengthening exercises with a goal of 2-3 sessions a week  and Start aerobic activity with a goal of 150 minutes a week at moderate intensity.  Plan in gym workouts and outdoor walks into calendar with a goal of 30+ min 5 days/ wk  Pharmacotherapy changes for the treatment of obesity: none  ASSOCIATED CONDITIONS ADDRESSED TODAY  Overweight (BMI 25.0-29.9) Though she has regained some weight, her BMI remains <30 and % body fat is 35.   She has retired since her last visit so we discussed options of gym options during the day, meal planning, eating on a schedule, limiting meals out and tracking options.  She felt better around 168 lb where she was last December and we set this as a goal for maintenance.  BMI 28.0-28.9,adult  Vitamin D  deficiency Last vitamin D   Lab Results  Component Value Date   VD25OH 54.1 07/08/2022   She is taking both calcium and vitamin D  daily for hx of vitamin D  def and osteopenia.   Plan to repeat vitamin D  level if not done by PCP next visit  History of obesity Improved.  She has done well in >2 years with mindful eating, improved food choices and portion control.  She has not used AOMs.     She was informed of the importance of frequent follow up visits to maximize her  success with intensive lifestyle modifications for her multiple health conditions.   ATTESTASTION STATEMENTS:  Reviewed by clinician on day of visit: allergies, medications, problem list, medical history, surgical history, family history, social history, and previous encounter notes pertinent to obesity diagnosis.   I have personally spent 25 minutes total time today in preparation, patient care, nutritional counseling and education,  and documentation for this visit, including the following: review of most recent clinical lab tests, , reviewing medical assistant documentation, review and interpretation of bioimpedence results.     Darice Haddock, D.O. DABFM, DABOM [facility] [address] [place], [place] [address] [phone]

## 2023-10-17 ENCOUNTER — Other Ambulatory Visit (HOSPITAL_COMMUNITY): Payer: Self-pay

## 2023-10-22 ENCOUNTER — Other Ambulatory Visit (HOSPITAL_COMMUNITY): Payer: Self-pay

## 2023-11-27 ENCOUNTER — Ambulatory Visit: Admitting: Family Medicine

## 2023-11-27 ENCOUNTER — Encounter: Payer: Self-pay | Admitting: Family Medicine

## 2023-11-27 VITALS — BP 136/70 | HR 60 | Temp 98.0°F | Ht 66.0 in | Wt 179.0 lb

## 2023-11-27 DIAGNOSIS — Z6828 Body mass index (BMI) 28.0-28.9, adult: Secondary | ICD-10-CM

## 2023-11-27 DIAGNOSIS — E663 Overweight: Secondary | ICD-10-CM | POA: Diagnosis not present

## 2023-11-27 NOTE — Patient Instructions (Signed)
 Continue to track daily calories: 1200-1400 cal/ day for weight loss  Check out the gym: Group exercise or cardio/ weight training

## 2023-11-27 NOTE — Progress Notes (Signed)
 Office: [phone]  /  Fax: [phone]  WEIGHT SUMMARY AND BIOMETRICS  Starting Date: 09/05/21  Starting Weight: 200lb   Weight Lost Since Last Visit: 0lb   Vitals Temp: 98 F (36.7 C) BP: 136/70 Pulse Rate: 60 SpO2: 97 %   Body Composition  Body Fat %: 36.2 % Fat Mass (lbs): 65 lbs Muscle Mass (lbs): 108.8 lbs Total Body Water (lbs): 72.8 lbs Visceral Fat Rating : 10     HPI  Chief Complaint: OBESITY  Felicia Sullivan is here to discuss her progress with her obesity treatment plan. She is on the the Category 2 Plan and states she is following her eating plan approximately 85-90 % of the time. She states she is exercising 45 minutes 3 times per week.  Interval History:  Since last office visit she is down 0 lb She started tracking calories 2 weeks ago She has not yet started silver sneakers but she did start walking outdoors 3 days/ wk She is tracking 1200-1400 cal/ day and some days feeling hungry She has net weight loss of 21 pounds and 2+ years of medically supervised weight management patient This is a 10.5% total body weight loss She would like to get back down to about 170 pounds and maintain her weight there She does enjoy cooking and baking   Pharmacotherapy: None  PHYSICAL EXAM:  Blood pressure 136/70, pulse 60, temperature 98 F (36.7 C), height 5' 6 (1.676 m), weight 179 lb (81.2 kg), SpO2 97%. Body mass index is 28.89 kg/m.  General: She is healthy appearing, cooperative, alert, well developed, and in no acute distress. PSYCH: Has normal mood, affect and thought process.   Lungs: Normal breathing effort, no conversational dyspnea.   ASSESSMENT AND PLAN  TREATMENT PLAN FOR OBESITY:  Recommended Dietary Goals  Felicia Sullivan is currently in the action stage of change. As such, her goal is to continue weight management plan. She has agreed to keeping a food journal and adhering to recommended goals of 1200-1400 calories and 70 to 90 g of  protein.  Behavioral Intervention  We discussed the following Behavioral Modification Strategies today: increasing lean protein intake to established goals, increasing fiber rich foods, increasing water intake , work on tracking and journaling calories using tracking application, keeping healthy foods at home, avoiding temptations and identifying enticing environmental cues, and continue to practice mindfulness when eating.  Additional resources provided today: NA  Recommended Physical Activity Goals  Felicia Sullivan has been advised to work up to 150 minutes of moderate intensity aerobic activity a week and strengthening exercises 2-3 times per week for cardiovascular health, weight loss maintenance and preservation of muscle mass.   She has agreed to [employer] strengthening exercises with a goal of 2-3 sessions a week  and Increase the intensity, frequency or duration of aerobic exercises   Recommend adding in resistance training at the gym 2 days a week-she has access with Silver sneakers She may do cardio either outdoors or at the gym with a target goal of 30+ minutes 5 days a week  Pharmacotherapy changes for the treatment of obesity: None  ASSOCIATED CONDITIONS ADDRESSED TODAY  Overweight (BMI 25.0-29.9) Overall, patient has done well with a 10% total body weight loss and 2+ years of medically supervised weight management able to keep her BMI under 30.  She is currently at a healthy body weight like to keep her body weight around 170 pounds which she felt better.  She may continue to track her calorie intake averaging 1200 to  1400 cal/day for calorie deficit and then moving up to about 1500 cal/day for maintenance of weight.  She has room for improvement with regular exercise to include both cardio and resistance training as discussed above Minimize intake of high sugar foods and drinks, prioritizing lean protein and fiber with meals.  BMI 28.0-28.9,adult      She was informed of the importance  of frequent follow up visits to maximize her success with intensive lifestyle modifications for her multiple health conditions.   ATTESTASTION STATEMENTS:  Reviewed by clinician on day of visit: allergies, medications, problem list, medical history, surgical history, family history, social history, and previous encounter notes pertinent to obesity diagnosis.     Darice Haddock, D.O. DABFM, DABOM [facility] [address] [place], [place] [address] [phone]

## 2024-01-14 ENCOUNTER — Ambulatory Visit: Admitting: Family Medicine

## 2024-02-03 ENCOUNTER — Other Ambulatory Visit (HOSPITAL_COMMUNITY): Payer: Self-pay

## 2024-02-03 MED ORDER — FLUZONE HIGH-DOSE 0.5 ML IM SUSY
0.5000 mL | PREFILLED_SYRINGE | INTRAMUSCULAR | 0 refills | Status: AC
  Filled 2024-02-03: qty 0.5, 1d supply, fill #0

## 2024-02-03 MED ORDER — COVID-19 MRNA VAC-TRIS(PFIZER) 30 MCG/0.3ML IM SUSY
0.3000 mL | PREFILLED_SYRINGE | Freq: Once | INTRAMUSCULAR | 0 refills | Status: AC
  Filled 2024-02-03: qty 0.3, 1d supply, fill #0

## 2024-02-11 ENCOUNTER — Ambulatory Visit (INDEPENDENT_AMBULATORY_CARE_PROVIDER_SITE_OTHER): Admitting: Family Medicine

## 2024-02-11 ENCOUNTER — Encounter: Payer: Self-pay | Admitting: Family Medicine

## 2024-02-11 VITALS — BP 133/84 | HR 67 | Temp 98.6°F | Ht 66.0 in | Wt 184.0 lb

## 2024-02-11 DIAGNOSIS — E663 Overweight: Secondary | ICD-10-CM | POA: Diagnosis not present

## 2024-02-11 DIAGNOSIS — Z8639 Personal history of other endocrine, nutritional and metabolic disease: Secondary | ICD-10-CM

## 2024-02-11 DIAGNOSIS — Z6829 Body mass index (BMI) 29.0-29.9, adult: Secondary | ICD-10-CM

## 2024-02-11 DIAGNOSIS — Z713 Dietary counseling and surveillance: Secondary | ICD-10-CM | POA: Diagnosis not present

## 2024-02-11 NOTE — Progress Notes (Signed)
 "  Office: [phone]  /  Fax: [phone]  WEIGHT SUMMARY AND BIOMETRICS  Starting Date: 09/05/21  Starting Weight: 200lb   Weight Lost Since Last Visit: 0lb   Vitals Temp: 98.6 F (37 C) BP: 133/84 Pulse Rate: 67 SpO2: 97 %   Body Composition  Body Fat %: 36.4 % Fat Mass (lbs): 67.2 lbs Muscle Mass (lbs): 111.4 lbs Total Body Water (lbs): 73.8 lbs Visceral Fat Rating : 10    HPI  Chief Complaint: OBESITY  Felicia Sullivan is here to discuss her progress with her obesity treatment plan. She is on the the Category 2 Plan and states she is following her eating plan approximately 30 % of the time. She states she is exercising 30-60 minutes 3 times per week.   Interval History:  Since last office visit she is up 5 lb She was changed to 1500 kcal Mediterranean diet last visit (not on) She did more cooking and baking over the holidays She hasn't been working out quite as much but did join [employer] last week She did retire 3 mos ago and this has changed her eating schedule She has struggled a little bit more with snacking (still making decent choices) She has cut back on sweets now that the holidays are over She remains net down 16 lb in 2.5 years  Pharmacotherapy: none  PHYSICAL EXAM:  Blood pressure 133/84, pulse 67, temperature 98.6 F (37 C), height 5' 6 (1.676 m), weight 184 lb (83.5 kg), SpO2 97%. Body mass index is 29.7 kg/m.  General: She is healthy appearing, cooperative, alert, well developed, and in no acute distress. PSYCH: Has normal mood, affect and thought process.   Lungs: Normal breathing effort, no conversational dyspnea.  ASSESSMENT AND PLAN  TREATMENT PLAN FOR OBESITY:  Recommended Dietary Goals  Felicia Sullivan is currently in the action stage of change. As such, her goal is to continue weight management plan. She has agreed to using Chat GPT to create a 1400 calorie Mediterranean diet meal plan, reviewed with patient today  Behavioral  Intervention  We discussed the following Behavioral Modification Strategies today: increasing lean protein intake to established goals, increasing fiber rich foods, increasing water intake , work on meal planning and preparation, keeping healthy foods at home, continue to practice mindfulness when eating, planning for success, and better snacking choices.  Additional resources provided today: NA  Recommended Physical Activity Goals  Felicia Sullivan has been advised to work up to 150 minutes of moderate intensity aerobic activity a week and strengthening exercises 2-3 times per week for cardiovascular health, weight loss maintenance and preservation of muscle mass.   She has agreed to Increase the intensity, frequency or duration of strengthening exercises  and Increase the intensity, frequency or duration of aerobic exercises   Reviewed exercise change goals on AVS with patient  Pharmacotherapy changes for the treatment of obesity: none  ASSOCIATED CONDITIONS ADDRESSED TODAY  Overweight (BMI 25.0-29.9) Overall, she has kept her BMI <30 with a goal body fat % <35, currently at 36%.  She is not far from her target weight.  Advise 1400 kcal Mediterranean diet, creating with AI and NOT resuming previous cat 2 meal plan  She has only recently started at the gym Recommend cardio + weights at gym 3 days/ wk + 2 additional days of walking 30-45 min  Keep junk food out of the house Work on meal planning, cooking at home, lean protein and fiber intake.  BMI 29.0-29.9,adult  History of obesity Improving  Dietary  counseling Reviewed plan of care with specific targets for calories and protein intake 20 min spent face to face in discussion on dietary counseling with patient    She was informed of the importance of frequent follow up visits to maximize her success with intensive lifestyle modifications for her multiple health conditions.   ATTESTASTION STATEMENTS:  Reviewed by clinician on day of  visit: allergies, medications, problem list, medical history, surgical history, family history, social history, and previous encounter notes pertinent to obesity diagnosis.   I personally spent a total of 24 minutes in the care of the patient today including preparing to see the patient, getting/reviewing separately obtained history, performing a medically appropriate exam/evaluation, counseling and educating, and documenting clinical information in the EHR.    Felicia Sullivan, D.O. DABFM, DABOM [facility] [address] [place], [place] [address] [phone] "

## 2024-02-11 NOTE — Patient Instructions (Addendum)
 Use the Chat GPT app to create a new meal plan: 1400 calorie Mediterrranean Meal plan You can swap out meals and snacks but keep the calories the same  Great job getting started at the gym! Aim for 3 days/ wk and add in a 30-45 min walk 2 days/ wk - aim for cardio and weight training at the gym

## 2024-02-18 ENCOUNTER — Other Ambulatory Visit (HOSPITAL_COMMUNITY): Payer: Self-pay

## 2024-03-30 ENCOUNTER — Ambulatory Visit: Admitting: Family Medicine
